# Patient Record
Sex: Male | Born: 1965 | Race: White | Hispanic: No | Marital: Married | Smoking: Former smoker
Health system: Southern US, Community
[De-identification: ages and names within clinical notes are randomized; demographics above are authoritative.]

## PROBLEM LIST (undated history)

## (undated) DIAGNOSIS — M1990 Unspecified osteoarthritis, unspecified site: Secondary | ICD-10-CM

## (undated) DIAGNOSIS — I10 Essential (primary) hypertension: Secondary | ICD-10-CM

## (undated) DIAGNOSIS — N289 Disorder of kidney and ureter, unspecified: Secondary | ICD-10-CM

## (undated) DIAGNOSIS — Z8489 Family history of other specified conditions: Secondary | ICD-10-CM

## (undated) DIAGNOSIS — T8859XA Other complications of anesthesia, initial encounter: Secondary | ICD-10-CM

## (undated) DIAGNOSIS — K219 Gastro-esophageal reflux disease without esophagitis: Secondary | ICD-10-CM

## (undated) HISTORY — PX: CHOLECYSTECTOMY: SHX55

---

## 1999-01-02 ENCOUNTER — Encounter: Payer: Self-pay | Admitting: Emergency Medicine

## 1999-01-02 ENCOUNTER — Emergency Department (HOSPITAL_COMMUNITY): Admission: EM | Admit: 1999-01-02 | Discharge: 1999-01-02 | Payer: Self-pay | Admitting: Emergency Medicine

## 2002-03-24 ENCOUNTER — Emergency Department (HOSPITAL_COMMUNITY): Admission: EM | Admit: 2002-03-24 | Discharge: 2002-03-24 | Payer: Self-pay | Admitting: *Deleted

## 2012-03-22 ENCOUNTER — Emergency Department (HOSPITAL_COMMUNITY): Payer: Worker's Compensation

## 2012-03-22 ENCOUNTER — Emergency Department (HOSPITAL_COMMUNITY)
Admission: EM | Admit: 2012-03-22 | Discharge: 2012-03-22 | Disposition: A | Discharge status: Home / Self Care | Payer: Worker's Compensation | Attending: Emergency Medicine | Admitting: Emergency Medicine

## 2012-03-22 ENCOUNTER — Encounter (HOSPITAL_COMMUNITY): Payer: Self-pay | Admitting: Cardiology

## 2012-03-22 DIAGNOSIS — S86919A Strain of unspecified muscle(s) and tendon(s) at lower leg level, unspecified leg, initial encounter: Secondary | ICD-10-CM

## 2012-03-22 DIAGNOSIS — S86819A Strain of other muscle(s) and tendon(s) at lower leg level, unspecified leg, initial encounter: Secondary | ICD-10-CM | POA: Insufficient documentation

## 2012-03-22 DIAGNOSIS — I10 Essential (primary) hypertension: Secondary | ICD-10-CM | POA: Insufficient documentation

## 2012-03-22 DIAGNOSIS — Z79899 Other long term (current) drug therapy: Secondary | ICD-10-CM | POA: Insufficient documentation

## 2012-03-22 DIAGNOSIS — X58XXXA Exposure to other specified factors, initial encounter: Secondary | ICD-10-CM | POA: Insufficient documentation

## 2012-03-22 DIAGNOSIS — E119 Type 2 diabetes mellitus without complications: Secondary | ICD-10-CM | POA: Insufficient documentation

## 2012-03-22 DIAGNOSIS — K219 Gastro-esophageal reflux disease without esophagitis: Secondary | ICD-10-CM | POA: Insufficient documentation

## 2012-03-22 DIAGNOSIS — S838X9A Sprain of other specified parts of unspecified knee, initial encounter: Secondary | ICD-10-CM | POA: Insufficient documentation

## 2012-03-22 DIAGNOSIS — Y9301 Activity, walking, marching and hiking: Secondary | ICD-10-CM | POA: Insufficient documentation

## 2012-03-22 HISTORY — DX: Gastro-esophageal reflux disease without esophagitis: K21.9

## 2012-03-22 HISTORY — DX: Essential (primary) hypertension: I10

## 2012-03-22 HISTORY — DX: Disorder of kidney and ureter, unspecified: N28.9

## 2012-03-22 MED ORDER — IBUPROFEN 800 MG PO TABS: 800.0000 mg | ORAL_TABLET | Freq: Three times a day (TID) | ORAL | Status: DC

## 2012-03-22 MED ORDER — IBUPROFEN 600 MG PO TABS: 600.0000 mg | ORAL_TABLET | Freq: Four times a day (QID) | ORAL | Status: DC | PRN

## 2012-03-22 NOTE — ED Provider Notes (Signed)
History     CSN: [id number]  Arrival date & time 03/22/12  [place]   First MD Initiated Contact with Patient 03/22/12 1903      Chief Complaint  Patient presents with  . Knee Pain     Patient is a 46 y.o. male presenting with knee pain. The history is provided by the patient.  Knee Pain This is a new problem. The current episode started less than 1 hour ago. The problem occurs constantly. The problem has not changed since onset.Pertinent negatives include no abdominal pain and no shortness of breath. The symptoms are aggravated by standing. The symptoms are relieved by rest. He has tried nothing for the symptoms.  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  Reports h/o "cleaning out cartilage" in his knees, but this was not recently    Past Medical History  Diagnosis Date  . GERD (gastroesophageal reflux disease)   . Diabetes mellitus   . Hypertension   . Renal disorder     Kidney Stones    History reviewed. No pertinent past surgical history.  History reviewed. No pertinent family history.  History  Substance Use Topics  . Smoking status: Not on file  . Smokeless tobacco: Not on file  . Alcohol Use:       Review of Systems  Respiratory: Negative for shortness of breath.   Gastrointestinal: Negative for abdominal pain.    Allergies  Penicillins and Erythromycin  Home Medications   Current Outpatient Rx  Name Route Sig Dispense Refill  . GLIPIZIDE 5 MG PO TABS Oral Take 5 mg by mouth 2 (two) times daily before a meal.    . LISINOPRIL 10 MG PO TABS Oral Take 10 mg by mouth every morning.    Marland Kitchen METFORMIN HCL 500 MG PO TABS Oral Take 1,000 mg by mouth 2 (two) times daily with a meal.    . DIABETES HEALTH FORMULA PO Oral Take 1 tablet by mouth daily.    Marland Kitchen OMEPRAZOLE 20 MG PO CPDR Oral Take 20 mg by mouth daily.       BP 150/93  Pulse 86  Resp 20  SpO2 99%  Physical Exam CONSTITUTIONAL: Well developed/well nourished HEAD AND FACE: Normocephalic/atraumatic EYES: EOMI/PERRL ENMT: Mucous membranes moist NECK: supple no meningeal signs SPINE:entire spine nontender CV: S1/S2 noted, no murmurs/rubs/gallops noted LUNGS: Lungs are clear to auscultation bilaterally, no apparent distress ABDOMEN: soft, nontender, no rebound or guarding NEURO: Pt is awake/alert, moves all extremitiesx4 EXTREMITIES: pulses normal, full ROM Tenderness to medial aspect of right knee, no bruising/edema/erythema noted.  He is able to range the right knee No other bony tenderness to the right LE/ankle/knee.  Distal pulses intact. SKIN: warm, color normal PSYCH: no abnormalities of mood noted  ED Course  Procedures    Knee imaging pending at this time   MDM  Nursing notes reviewed and considered in documentation         Joya Gaskins, MD 03/22/12 1958

## 2012-03-22 NOTE — ED Provider Notes (Signed)
Assumed pt care @ 2005.  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  Patient states he is able to ambulate, and able to flex and extend his knee but felt pain to the medial aspect of his right knee.  Pain is sharp, worsened with ambulating, and improves with rest. He denies pain to his right hip all right ankle. He denies numbness. On exam, point tenderness noted to medial aspects of right knee without overlying deformity. Negative anterior and posterior draw test. Pain with varus and valgus maneuver without laxity. Able to ambulate.   Patient will be given knee sleeves for support, rice therapy, and ibuprofen for pain.  Patient agrees to follow up with Dr. Tinnie Gens from [facility] orthopedic.    No results found for this or any previous visit. Dg Knee Complete 4 Views Right  03/22/2012  *RADIOLOGY REPORT*  Clinical Data: Pain in the medial knee from falling into a hole earlier.  No bruising or swelling.  RIGHT KNEE - COMPLETE 4+ VIEW  Comparison: None.  Findings: Degenerative changes are identified, involving the lateral patellofemoral compartments.  No evidence for acute fracture.  No definite joint effusion.  IMPRESSION: Mild degenerative changes. No evidence for acute osseous abnormality.  Original Report Authenticated By: Patterson Hammersmith, M.D.      Fayrene Helper, PA-C 03/22/12 2023

## 2012-03-22 NOTE — ED Notes (Signed)
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 Denies any other symptoms at this time.

## 2012-03-22 NOTE — Discharge Instructions (Signed)
Knee Sprain  You have a knee sprain. Sprains are painful injuries to the joints. A sprain is a partial or complete tearing of ligaments. Ligaments are tough, fibrous tissues that hold bones together at the joints. A strain (sprain) has occurred when a ligament is stretched or damaged. This injury may take several weeks to heal. This is often the same length of time as a bone fracture (break in bone) takes to heal. Even though a fracture (bone break) may not have occurred, the recovery times may be similar.  HOME CARE INSTRUCTIONS   · Rest the injured area for as long as directed by your caregiver. Then slowly start using the joint as directed by your caregiver and as the pain allows. Use crutches as directed. If the knee was splinted or casted, continue use and care as directed. If an ace bandage has been applied today, it should be removed and reapplied every 3 to 4 hours. It should not be applied tightly, but firmly enough to keep swelling down. Watch toes and feet for swelling, bluish discoloration, coldness, numbness or excessive pain. If any of these symptoms occur, remove the ace bandage and reapply more loosely.If these symptoms persist, seek medical attention.  · For the first 24 hours, lie down. Keep the injured extremity elevated on two pillows.  · Apply ice to the injured area for 15 to 20 minutes every couple hours. Repeat this 3 to 4 times per day for the first 48 hours. Put the ice in a plastic bag and place a towel between the bag of ice and your skin.  · Wear any splinting, casting, or elastic bandage applications as instructed.  · Only take over-the-counter or prescription medicines for pain, discomfort, or fever as directed by your caregiver. Do not use aspirin immediately after the injury unless instructed by your caregiver. Aspirin can cause increased bleeding and bruising of the tissues.  · If you were given crutches, continue to use them as instructed. Do not resume weight bearing on the  affected extremity until instructed.  Persistent pain and inability to use the injured area as directed for more than 2 to 3 days are warning signs. If this happens you should see a caregiver for a follow-up visit as soon as possible. Initially, a hairline fracture (this is the same as a broken bone) may not be evident on x-rays. Persistent pain and swelling indicate that further evaluation, non-weight bearing (use of crutches as instructed), and/or further x-rays are indicated. X-rays may sometimes not show a small fracture until a week or ten days later. Make a follow-up appointment with your own caregiver or one to whom we have referred you. A radiologist (specialist in reading x-rays) may re-read your X-rays. Make sure you know how you are to get your x-ray results. Do not assume everything is normal if you do not hear from us.  SEEK MEDICAL CARE IF:   · Bruising, swelling, or pain increases.  · You have cold or numb toes  · You have continuing difficulty or pain with walking.  SEEK IMMEDIATE MEDICAL CARE IF:   · Your toes are cold, numb or blue.  · The pain is not responding to medications and continues to stay the same or get worse.  MAKE SURE YOU:   · Understand these instructions.  · Will watch your condition.  · Will get help right away if you are not doing well or get worse.  Document Released: 11/24/2005 Document Revised: 11/13/2011 Document Reviewed: 11/08/2007    [employer]® Patient Information ©2012 [employer].

## 2012-03-24 NOTE — ED Provider Notes (Signed)
Medical screening examination/treatment/procedure(s) were conducted as a shared visit with non-physician practitioner(s) and myself.  I personally evaluated the patient during the encounter   Joya Gaskins, MD 03/24/12 [phone]

## 2015-02-28 ENCOUNTER — Emergency Department (HOSPITAL_COMMUNITY): Payer: 59

## 2015-02-28 ENCOUNTER — Inpatient Hospital Stay (HOSPITAL_COMMUNITY)
Admission: EM | Admit: 2015-02-28 | Discharge: 2015-03-07 | DRG: 418 | Disposition: A | Discharge status: Home Health | Payer: 59 | Attending: Internal Medicine | Admitting: Internal Medicine

## 2015-02-28 ENCOUNTER — Encounter (HOSPITAL_COMMUNITY): Payer: Self-pay

## 2015-02-28 DIAGNOSIS — Y838 Other surgical procedures as the cause of abnormal reaction of the patient, or of later complication, without mention of misadventure at the time of the procedure: Secondary | ICD-10-CM | POA: Diagnosis not present

## 2015-02-28 DIAGNOSIS — Z880 Allergy status to penicillin: Secondary | ICD-10-CM

## 2015-02-28 DIAGNOSIS — R162 Hepatomegaly with splenomegaly, not elsewhere classified: Secondary | ICD-10-CM | POA: Diagnosis present

## 2015-02-28 DIAGNOSIS — R1011 Right upper quadrant pain: Secondary | ICD-10-CM

## 2015-02-28 DIAGNOSIS — R739 Hyperglycemia, unspecified: Secondary | ICD-10-CM

## 2015-02-28 DIAGNOSIS — L7621 Postprocedural hemorrhage and hematoma of skin and subcutaneous tissue following a dermatologic procedure: Secondary | ICD-10-CM | POA: Diagnosis not present

## 2015-02-28 DIAGNOSIS — Z87442 Personal history of urinary calculi: Secondary | ICD-10-CM

## 2015-02-28 DIAGNOSIS — M25519 Pain in unspecified shoulder: Secondary | ICD-10-CM

## 2015-02-28 DIAGNOSIS — K812 Acute cholecystitis with chronic cholecystitis: Secondary | ICD-10-CM | POA: Diagnosis present

## 2015-02-28 DIAGNOSIS — K8013 Calculus of gallbladder with acute and chronic cholecystitis with obstruction: Principal | ICD-10-CM | POA: Diagnosis present

## 2015-02-28 DIAGNOSIS — Z6841 Body Mass Index (BMI) 40.0 and over, adult: Secondary | ICD-10-CM

## 2015-02-28 DIAGNOSIS — I10 Essential (primary) hypertension: Secondary | ICD-10-CM | POA: Diagnosis present

## 2015-02-28 DIAGNOSIS — K529 Noninfective gastroenteritis and colitis, unspecified: Secondary | ICD-10-CM

## 2015-02-28 DIAGNOSIS — R1110 Vomiting, unspecified: Secondary | ICD-10-CM

## 2015-02-28 DIAGNOSIS — Y92234 Operating room of hospital as the place of occurrence of the external cause: Secondary | ICD-10-CM

## 2015-02-28 DIAGNOSIS — R7989 Other specified abnormal findings of blood chemistry: Secondary | ICD-10-CM | POA: Diagnosis not present

## 2015-02-28 DIAGNOSIS — Z79899 Other long term (current) drug therapy: Secondary | ICD-10-CM

## 2015-02-28 DIAGNOSIS — Z419 Encounter for procedure for purposes other than remedying health state, unspecified: Secondary | ICD-10-CM

## 2015-02-28 DIAGNOSIS — K5790 Diverticulosis of intestine, part unspecified, without perforation or abscess without bleeding: Secondary | ICD-10-CM | POA: Diagnosis present

## 2015-02-28 DIAGNOSIS — K7581 Nonalcoholic steatohepatitis (NASH): Secondary | ICD-10-CM | POA: Diagnosis present

## 2015-02-28 DIAGNOSIS — R109 Unspecified abdominal pain: Secondary | ICD-10-CM

## 2015-02-28 DIAGNOSIS — Z87891 Personal history of nicotine dependence: Secondary | ICD-10-CM

## 2015-02-28 DIAGNOSIS — E1165 Type 2 diabetes mellitus with hyperglycemia: Secondary | ICD-10-CM | POA: Diagnosis present

## 2015-02-28 DIAGNOSIS — K219 Gastro-esophageal reflux disease without esophagitis: Secondary | ICD-10-CM | POA: Diagnosis present

## 2015-02-28 DIAGNOSIS — W1789XA Other fall from one level to another, initial encounter: Secondary | ICD-10-CM | POA: Diagnosis not present

## 2015-02-28 DIAGNOSIS — E785 Hyperlipidemia, unspecified: Secondary | ICD-10-CM | POA: Diagnosis present

## 2015-02-28 DIAGNOSIS — L0390 Cellulitis, unspecified: Secondary | ICD-10-CM | POA: Diagnosis not present

## 2015-02-28 DIAGNOSIS — K8020 Calculus of gallbladder without cholecystitis without obstruction: Secondary | ICD-10-CM | POA: Diagnosis present

## 2015-02-28 DIAGNOSIS — R1031 Right lower quadrant pain: Secondary | ICD-10-CM | POA: Diagnosis not present

## 2015-02-28 DIAGNOSIS — Z881 Allergy status to other antibiotic agents status: Secondary | ICD-10-CM

## 2015-02-28 LAB — CBC WITH DIFFERENTIAL/PLATELET
BASOS ABS: 0 10*3/uL (ref 0.0–0.1)
BASOS PCT: 0 % (ref 0–1)
EOS ABS: 0.3 10*3/uL (ref 0.0–0.7)
Eosinophils Relative: 2 % (ref 0–5)
HCT: 47.3 % (ref 39.0–52.0)
Hemoglobin: 16.1 g/dL (ref 13.0–17.0)
LYMPHS ABS: 0.7 10*3/uL (ref 0.7–4.0)
LYMPHS PCT: 5 % — AB (ref 12–46)
MCH: 27.8 pg (ref 26.0–34.0)
MCHC: 34 g/dL (ref 30.0–36.0)
MCV: 81.7 fL (ref 78.0–100.0)
Monocytes Absolute: 0.7 10*3/uL (ref 0.1–1.0)
Monocytes Relative: 5 % (ref 3–12)
NEUTROS ABS: 11.6 10*3/uL — AB (ref 1.7–7.7)
NEUTROS PCT: 88 % — AB (ref 43–77)
PLATELETS: 209 10*3/uL (ref 150–400)
RBC: 5.79 MIL/uL (ref 4.22–5.81)
RDW: 13.8 % (ref 11.5–15.5)
WBC: 13.3 10*3/uL — AB (ref 4.0–10.5)

## 2015-02-28 LAB — URINE MICROSCOPIC-ADD ON

## 2015-02-28 LAB — I-STAT TROPONIN, ED: Troponin i, poc: 0 ng/mL (ref 0.00–0.08)

## 2015-02-28 LAB — URINALYSIS, ROUTINE W REFLEX MICROSCOPIC
Bilirubin Urine: NEGATIVE
Ketones, ur: NEGATIVE mg/dL
LEUKOCYTES UA: NEGATIVE
Nitrite: NEGATIVE
Protein, ur: 100 mg/dL — AB
SPECIFIC GRAVITY, URINE: 1.027 (ref 1.005–1.030)
Urobilinogen, UA: 0.2 mg/dL (ref 0.0–1.0)
pH: 5 (ref 5.0–8.0)

## 2015-02-28 LAB — COMPREHENSIVE METABOLIC PANEL
ALBUMIN: 4.8 g/dL (ref 3.5–5.2)
ALK PHOS: 65 U/L (ref 39–117)
ALT: 47 U/L (ref 0–53)
AST: 28 U/L (ref 0–37)
Anion gap: 12 (ref 5–15)
BILIRUBIN TOTAL: 1.4 mg/dL — AB (ref 0.3–1.2)
BUN: 18 mg/dL (ref 6–23)
CHLORIDE: 103 mmol/L (ref 96–112)
CO2: 23 mmol/L (ref 19–32)
Calcium: 9.2 mg/dL (ref 8.4–10.5)
Creatinine, Ser: 1.14 mg/dL (ref 0.50–1.35)
GFR calc Af Amer: 88 mL/min — ABNORMAL LOW (ref >90)
GFR calc non Af Amer: 76 mL/min — ABNORMAL LOW (ref >90)
Glucose, Bld: 279 mg/dL — ABNORMAL HIGH (ref 70–99)
POTASSIUM: 4.1 mmol/L (ref 3.5–5.1)
Sodium: 138 mmol/L (ref 135–145)
Total Protein: 7.6 g/dL (ref 6.0–8.3)

## 2015-02-28 LAB — LIPASE, BLOOD: Lipase: 45 U/L (ref 11–59)

## 2015-02-28 MED ORDER — ONDANSETRON HCL 4 MG/2ML IJ SOLN
4.0000 mg | Freq: Once | INTRAMUSCULAR | Status: AC
  Administered 2015-02-28: 4 mg via INTRAVENOUS
  Filled 2015-02-28: qty 2

## 2015-02-28 MED ORDER — IOHEXOL 300 MG/ML  SOLN
100.0000 mL | Freq: Once | INTRAMUSCULAR | Status: AC | PRN
  Administered 2015-02-28: 100 mL via INTRAVENOUS

## 2015-02-28 MED ORDER — ONDANSETRON HCL 4 MG/2ML IJ SOLN
4.0000 mg | Freq: Once | INTRAMUSCULAR | Status: AC
  Administered 2015-02-28: 4 mg via INTRAVENOUS

## 2015-02-28 MED ORDER — FENTANYL CITRATE 0.05 MG/ML IJ SOLN
INTRAMUSCULAR | Status: AC
  Filled 2015-02-28: qty 2

## 2015-02-28 MED ORDER — SODIUM CHLORIDE 0.9 % IV SOLN
INTRAVENOUS | Status: DC
  Administered 2015-02-28: 20:00:00 via INTRAVENOUS

## 2015-02-28 MED ORDER — ONDANSETRON HCL 4 MG/2ML IJ SOLN
INTRAMUSCULAR | Status: AC
  Filled 2015-02-28: qty 2

## 2015-02-28 MED ORDER — HYDROMORPHONE HCL 1 MG/ML IJ SOLN
1.0000 mg | Freq: Once | INTRAMUSCULAR | Status: AC
  Administered 2015-03-01: 1 mg via INTRAVENOUS
  Filled 2015-02-28: qty 1

## 2015-02-28 MED ORDER — ONDANSETRON HCL 4 MG/2ML IJ SOLN
4.0000 mg | Freq: Once | INTRAMUSCULAR | Status: AC
  Administered 2015-03-01: 4 mg via INTRAVENOUS
  Filled 2015-02-28: qty 2

## 2015-02-28 MED ORDER — HYDROMORPHONE HCL 1 MG/ML IJ SOLN
1.0000 mg | Freq: Once | INTRAMUSCULAR | Status: AC
  Administered 2015-02-28: 1 mg via INTRAVENOUS
  Filled 2015-02-28: qty 1

## 2015-02-28 MED ORDER — SODIUM CHLORIDE 0.9 % IV BOLUS (SEPSIS)
1000.0000 mL | Freq: Once | INTRAVENOUS | Status: AC
  Administered 2015-02-28: 1000 mL via INTRAVENOUS

## 2015-02-28 MED ORDER — FENTANYL CITRATE 0.05 MG/ML IJ SOLN
50.0000 ug | Freq: Once | INTRAMUSCULAR | Status: AC
  Administered 2015-02-28: 50 ug via INTRAVENOUS

## 2015-02-28 MED ORDER — PANTOPRAZOLE SODIUM 40 MG IV SOLR
40.0000 mg | Freq: Once | INTRAVENOUS | Status: AC
  Administered 2015-02-28: 40 mg via INTRAVENOUS
  Filled 2015-02-28: qty 40

## 2015-02-28 NOTE — ED Notes (Signed)
Pt given urinal and made aware of need for urine specimen 

## 2015-02-28 NOTE — ED Notes (Addendum)
Pt c/o upper abdominal pain and n/v starting this afternoon.  Pain score 10/10.  Pt reports pain decreases w/ palpation.  Denies diarrhea and GU complaints.

## 2015-02-28 NOTE — ED Notes (Signed)
Patient is aware a urine sample is needed. He will inform us when he can give. Will notify nurse and edp

## 2015-02-28 NOTE — ED Notes (Signed)
Pt actively vomiting at this time, MD notified zofran 4mg  IV given per protocol.

## 2015-02-28 NOTE — ED Notes (Signed)
Bed: WA16 Expected date:  Expected time:  Means of arrival:  Comments: EMS 

## 2015-03-01 ENCOUNTER — Inpatient Hospital Stay (HOSPITAL_COMMUNITY): Payer: 59 | Admitting: Anesthesiology

## 2015-03-01 ENCOUNTER — Emergency Department (HOSPITAL_COMMUNITY): Payer: 59

## 2015-03-01 ENCOUNTER — Encounter (HOSPITAL_COMMUNITY)
Admission: EM | Disposition: A | Discharge status: Home Health | Payer: Self-pay | Source: Home / Self Care | Attending: Internal Medicine

## 2015-03-01 ENCOUNTER — Inpatient Hospital Stay (HOSPITAL_COMMUNITY): Payer: 59

## 2015-03-01 ENCOUNTER — Encounter (HOSPITAL_COMMUNITY): Payer: Self-pay | Admitting: Certified Registered"

## 2015-03-01 DIAGNOSIS — K819 Cholecystitis, unspecified: Secondary | ICD-10-CM

## 2015-03-01 DIAGNOSIS — Z79899 Other long term (current) drug therapy: Secondary | ICD-10-CM | POA: Diagnosis not present

## 2015-03-01 DIAGNOSIS — R109 Unspecified abdominal pain: Secondary | ICD-10-CM | POA: Diagnosis present

## 2015-03-01 DIAGNOSIS — Y838 Other surgical procedures as the cause of abnormal reaction of the patient, or of later complication, without mention of misadventure at the time of the procedure: Secondary | ICD-10-CM | POA: Diagnosis not present

## 2015-03-01 DIAGNOSIS — E785 Hyperlipidemia, unspecified: Secondary | ICD-10-CM | POA: Diagnosis present

## 2015-03-01 DIAGNOSIS — L7621 Postprocedural hemorrhage and hematoma of skin and subcutaneous tissue following a dermatologic procedure: Secondary | ICD-10-CM | POA: Diagnosis not present

## 2015-03-01 DIAGNOSIS — Z87891 Personal history of nicotine dependence: Secondary | ICD-10-CM | POA: Diagnosis not present

## 2015-03-01 DIAGNOSIS — K8013 Calculus of gallbladder with acute and chronic cholecystitis with obstruction: Secondary | ICD-10-CM | POA: Diagnosis present

## 2015-03-01 DIAGNOSIS — Z87442 Personal history of urinary calculi: Secondary | ICD-10-CM | POA: Diagnosis not present

## 2015-03-01 DIAGNOSIS — Z881 Allergy status to other antibiotic agents status: Secondary | ICD-10-CM | POA: Diagnosis not present

## 2015-03-01 DIAGNOSIS — R1031 Right lower quadrant pain: Secondary | ICD-10-CM | POA: Diagnosis present

## 2015-03-01 DIAGNOSIS — R7989 Other specified abnormal findings of blood chemistry: Secondary | ICD-10-CM | POA: Diagnosis not present

## 2015-03-01 DIAGNOSIS — K7581 Nonalcoholic steatohepatitis (NASH): Secondary | ICD-10-CM | POA: Diagnosis present

## 2015-03-01 DIAGNOSIS — W1789XA Other fall from one level to another, initial encounter: Secondary | ICD-10-CM | POA: Diagnosis not present

## 2015-03-01 DIAGNOSIS — K812 Acute cholecystitis with chronic cholecystitis: Secondary | ICD-10-CM | POA: Diagnosis not present

## 2015-03-01 DIAGNOSIS — Z6841 Body Mass Index (BMI) 40.0 and over, adult: Secondary | ICD-10-CM | POA: Diagnosis not present

## 2015-03-01 DIAGNOSIS — R1084 Generalized abdominal pain: Secondary | ICD-10-CM | POA: Diagnosis not present

## 2015-03-01 DIAGNOSIS — R1011 Right upper quadrant pain: Secondary | ICD-10-CM | POA: Diagnosis not present

## 2015-03-01 DIAGNOSIS — K219 Gastro-esophageal reflux disease without esophagitis: Secondary | ICD-10-CM | POA: Diagnosis present

## 2015-03-01 DIAGNOSIS — L0390 Cellulitis, unspecified: Secondary | ICD-10-CM | POA: Diagnosis not present

## 2015-03-01 DIAGNOSIS — R162 Hepatomegaly with splenomegaly, not elsewhere classified: Secondary | ICD-10-CM | POA: Diagnosis present

## 2015-03-01 DIAGNOSIS — Z880 Allergy status to penicillin: Secondary | ICD-10-CM | POA: Diagnosis not present

## 2015-03-01 DIAGNOSIS — K5790 Diverticulosis of intestine, part unspecified, without perforation or abscess without bleeding: Secondary | ICD-10-CM | POA: Diagnosis present

## 2015-03-01 DIAGNOSIS — K8020 Calculus of gallbladder without cholecystitis without obstruction: Secondary | ICD-10-CM | POA: Diagnosis present

## 2015-03-01 DIAGNOSIS — E1165 Type 2 diabetes mellitus with hyperglycemia: Secondary | ICD-10-CM

## 2015-03-01 DIAGNOSIS — I10 Essential (primary) hypertension: Secondary | ICD-10-CM | POA: Diagnosis present

## 2015-03-01 DIAGNOSIS — Y92234 Operating room of hospital as the place of occurrence of the external cause: Secondary | ICD-10-CM | POA: Diagnosis not present

## 2015-03-01 HISTORY — PX: LAPAROSCOPIC CHOLECYSTECTOMY SINGLE PORT: SHX5891

## 2015-03-01 LAB — GLUCOSE, CAPILLARY
GLUCOSE-CAPILLARY: 277 mg/dL — AB (ref 70–99)
GLUCOSE-CAPILLARY: 206 mg/dL — AB (ref 70–99)
GLUCOSE-CAPILLARY: 292 mg/dL — AB (ref 70–99)
Glucose-Capillary: 196 mg/dL — ABNORMAL HIGH (ref 70–99)

## 2015-03-01 LAB — LACTIC ACID, PLASMA
LACTIC ACID, VENOUS: 1.9 mmol/L (ref 0.5–2.0)
Lactic Acid, Venous: 2 mmol/L (ref 0.5–2.0)

## 2015-03-01 LAB — COMPREHENSIVE METABOLIC PANEL
ALK PHOS: 56 U/L (ref 39–117)
ALT: 40 U/L (ref 0–53)
ANION GAP: 11 (ref 5–15)
AST: 21 U/L (ref 0–37)
Albumin: 4.1 g/dL (ref 3.5–5.2)
BILIRUBIN TOTAL: 1.6 mg/dL — AB (ref 0.3–1.2)
BUN: 21 mg/dL (ref 6–23)
CO2: 24 mmol/L (ref 19–32)
Calcium: 8.1 mg/dL — ABNORMAL LOW (ref 8.4–10.5)
Chloride: 103 mmol/L (ref 96–112)
Creatinine, Ser: 1.15 mg/dL (ref 0.50–1.35)
GFR calc Af Amer: 87 mL/min — ABNORMAL LOW (ref >90)
GFR, EST NON AFRICAN AMERICAN: 75 mL/min — AB (ref >90)
Glucose, Bld: 303 mg/dL — ABNORMAL HIGH (ref 70–99)
Potassium: 3.8 mmol/L (ref 3.5–5.1)
Sodium: 138 mmol/L (ref 135–145)
TOTAL PROTEIN: 6.7 g/dL (ref 6.0–8.3)

## 2015-03-01 LAB — SURGICAL PCR SCREEN
MRSA, PCR: NEGATIVE
Staphylococcus aureus: POSITIVE — AB

## 2015-03-01 SURGERY — LAPAROSCOPIC CHOLECYSTECTOMY SINGLE SITE
Anesthesia: General | Site: Abdomen

## 2015-03-01 MED ORDER — METRONIDAZOLE IN NACL 5-0.79 MG/ML-% IV SOLN
500.0000 mg | Freq: Three times a day (TID) | INTRAVENOUS | Status: DC
  Administered 2015-03-01 – 2015-03-03 (×7): 500 mg via INTRAVENOUS
  Filled 2015-03-01 (×8): qty 100

## 2015-03-01 MED ORDER — OXYCODONE HCL 5 MG PO TABS
5.0000 mg | ORAL_TABLET | ORAL | Status: DC | PRN
  Administered 2015-03-02 – 2015-03-03 (×5): 10 mg via ORAL
  Filled 2015-03-01 (×5): qty 2

## 2015-03-01 MED ORDER — KETOROLAC TROMETHAMINE 30 MG/ML IJ SOLN
INTRAMUSCULAR | Status: DC | PRN
  Administered 2015-03-01: 30 mg via INTRAVENOUS

## 2015-03-01 MED ORDER — PROMETHAZINE HCL 25 MG/ML IJ SOLN
INTRAMUSCULAR | Status: AC
  Filled 2015-03-01: qty 1

## 2015-03-01 MED ORDER — HYDROMORPHONE HCL 1 MG/ML IJ SOLN
0.2500 mg | INTRAMUSCULAR | Status: DC | PRN
  Administered 2015-03-01: 0.5 mg via INTRAVENOUS
  Administered 2015-03-01: 0.25 mg via INTRAVENOUS

## 2015-03-01 MED ORDER — ROCURONIUM BROMIDE 100 MG/10ML IV SOLN
INTRAVENOUS | Status: AC
  Filled 2015-03-01: qty 1

## 2015-03-01 MED ORDER — NEOSTIGMINE METHYLSULFATE 10 MG/10ML IV SOLN
INTRAVENOUS | Status: AC
  Filled 2015-03-01: qty 1

## 2015-03-01 MED ORDER — ACETAMINOPHEN 650 MG RE SUPP: 650.0000 mg | Freq: Four times a day (QID) | RECTAL | Status: DC | PRN

## 2015-03-01 MED ORDER — BUPIVACAINE-EPINEPHRINE 0.25% -1:200000 IJ SOLN
INTRAMUSCULAR | Status: DC | PRN
  Administered 2015-03-01: 100 mL

## 2015-03-01 MED ORDER — GLYCOPYRROLATE 0.2 MG/ML IJ SOLN
INTRAMUSCULAR | Status: AC
  Filled 2015-03-01: qty 3

## 2015-03-01 MED ORDER — INSULIN ASPART 100 UNIT/ML ~~LOC~~ SOLN
SUBCUTANEOUS | Status: AC
  Administered 2015-03-01: 7 [IU] via SUBCUTANEOUS
  Filled 2015-03-01: qty 1

## 2015-03-01 MED ORDER — HYDROMORPHONE HCL 1 MG/ML IJ SOLN
0.5000 mg | INTRAMUSCULAR | Status: DC | PRN
  Administered 2015-03-01 – 2015-03-03 (×3): 1 mg via INTRAVENOUS
  Filled 2015-03-01 (×3): qty 1

## 2015-03-01 MED ORDER — 0.9 % SODIUM CHLORIDE (POUR BTL) OPTIME
TOPICAL | Status: DC | PRN
  Administered 2015-03-01: 1000 mL

## 2015-03-01 MED ORDER — PANTOPRAZOLE SODIUM 40 MG IV SOLR
40.0000 mg | Freq: Every day | INTRAVENOUS | Status: DC
  Administered 2015-03-01: 40 mg via INTRAVENOUS
  Filled 2015-03-01 (×2): qty 40

## 2015-03-01 MED ORDER — CHLORHEXIDINE GLUCONATE 4 % EX LIQD
1.0000 "application " | Freq: Once | CUTANEOUS | Status: DC
  Filled 2015-03-01: qty 15

## 2015-03-01 MED ORDER — INSULIN ASPART 100 UNIT/ML ~~LOC~~ SOLN
0.0000 [IU] | Freq: Every day | SUBCUTANEOUS | Status: DC
  Administered 2015-03-01: 3 [IU] via SUBCUTANEOUS
  Administered 2015-03-02 – 2015-03-05 (×2): 2 [IU] via SUBCUTANEOUS

## 2015-03-01 MED ORDER — SODIUM CHLORIDE 0.9 % IV SOLN
INTRAVENOUS | Status: DC
  Administered 2015-03-01: 1000 mL via INTRAVENOUS
  Administered 2015-03-04: 50 mL/h via INTRAVENOUS

## 2015-03-01 MED ORDER — AZTREONAM 2 G IJ SOLR: 2.0000 g | Freq: Three times a day (TID) | INTRAMUSCULAR | Status: DC

## 2015-03-01 MED ORDER — LACTATED RINGERS IR SOLN
Status: DC | PRN
  Administered 2015-03-01: 2000 mL

## 2015-03-01 MED ORDER — MORPHINE SULFATE 4 MG/ML IJ SOLN
6.0000 mg | Freq: Once | INTRAMUSCULAR | Status: AC
  Administered 2015-03-01: 6 mg via INTRAVENOUS

## 2015-03-01 MED ORDER — ACETAMINOPHEN 500 MG PO TABS
1000.0000 mg | ORAL_TABLET | Freq: Three times a day (TID) | ORAL | Status: DC
  Administered 2015-03-01 – 2015-03-07 (×14): 1000 mg via ORAL
  Filled 2015-03-01 (×20): qty 2

## 2015-03-01 MED ORDER — OXYCODONE HCL 5 MG PO TABS: 5.0000 mg | ORAL_TABLET | ORAL | Status: DC | PRN

## 2015-03-01 MED ORDER — GLYCOPYRROLATE 0.2 MG/ML IJ SOLN
INTRAMUSCULAR | Status: DC | PRN
  Administered 2015-03-01: 0.6 mg via INTRAVENOUS

## 2015-03-01 MED ORDER — MIDAZOLAM HCL 2 MG/2ML IJ SOLN
INTRAMUSCULAR | Status: AC
  Filled 2015-03-01: qty 2

## 2015-03-01 MED ORDER — NEOSTIGMINE METHYLSULFATE 10 MG/10ML IV SOLN
INTRAVENOUS | Status: DC | PRN
  Administered 2015-03-01: 4 mg via INTRAVENOUS

## 2015-03-01 MED ORDER — SODIUM CHLORIDE 0.9 % IJ SOLN
INTRAMUSCULAR | Status: AC
  Filled 2015-03-01: qty 10

## 2015-03-01 MED ORDER — PROMETHAZINE HCL 25 MG/ML IJ SOLN
6.2500 mg | INTRAMUSCULAR | Status: DC | PRN
  Administered 2015-03-01: 6.25 mg via INTRAVENOUS

## 2015-03-01 MED ORDER — ONDANSETRON HCL 4 MG PO TABS
4.0000 mg | ORAL_TABLET | Freq: Four times a day (QID) | ORAL | Status: DC | PRN
  Filled 2015-03-01: qty 1

## 2015-03-01 MED ORDER — INSULIN ASPART 100 UNIT/ML ~~LOC~~ SOLN
0.0000 [IU] | SUBCUTANEOUS | Status: DC
  Administered 2015-03-01: 2 [IU] via SUBCUTANEOUS
  Administered 2015-03-01: 5 [IU] via SUBCUTANEOUS
  Administered 2015-03-01: 7 [IU] via SUBCUTANEOUS
  Administered 2015-03-01: 3 [IU] via SUBCUTANEOUS

## 2015-03-01 MED ORDER — ONDANSETRON HCL 4 MG/2ML IJ SOLN
4.0000 mg | Freq: Four times a day (QID) | INTRAMUSCULAR | Status: DC | PRN
  Administered 2015-03-01 – 2015-03-03 (×3): 4 mg via INTRAVENOUS
  Filled 2015-03-01 (×3): qty 2

## 2015-03-01 MED ORDER — HYDROMORPHONE HCL 1 MG/ML IJ SOLN: 1.0000 mg | INTRAMUSCULAR | Status: DC | PRN

## 2015-03-01 MED ORDER — INSULIN ASPART 100 UNIT/ML ~~LOC~~ SOLN
0.0000 [IU] | Freq: Three times a day (TID) | SUBCUTANEOUS | Status: DC
  Administered 2015-03-02: 4 [IU] via SUBCUTANEOUS
  Administered 2015-03-02: 7 [IU] via SUBCUTANEOUS
  Administered 2015-03-02: 4 [IU] via SUBCUTANEOUS
  Administered 2015-03-03: 7 [IU] via SUBCUTANEOUS
  Administered 2015-03-03 – 2015-03-04 (×4): 4 [IU] via SUBCUTANEOUS
  Administered 2015-03-05: 7 [IU] via SUBCUTANEOUS
  Administered 2015-03-05 – 2015-03-07 (×6): 4 [IU] via SUBCUTANEOUS

## 2015-03-01 MED ORDER — TECHNETIUM TC 99M MEBROFENIN IV KIT
5.3000 | PACK | Freq: Once | INTRAVENOUS | Status: AC | PRN
  Administered 2015-03-01: 5 via INTRAVENOUS

## 2015-03-01 MED ORDER — ROCURONIUM BROMIDE 100 MG/10ML IV SOLN
INTRAVENOUS | Status: DC | PRN
  Administered 2015-03-01 (×2): 10 mg via INTRAVENOUS
  Administered 2015-03-01: 40 mg via INTRAVENOUS

## 2015-03-01 MED ORDER — DEXTROSE 5 % IV SOLN
2.0000 g | Freq: Three times a day (TID) | INTRAVENOUS | Status: DC
  Administered 2015-03-01 – 2015-03-03 (×7): 2 g via INTRAVENOUS
  Filled 2015-03-01 (×7): qty 2

## 2015-03-01 MED ORDER — PROPOFOL 10 MG/ML IV BOLUS
INTRAVENOUS | Status: AC
  Filled 2015-03-01: qty 20

## 2015-03-01 MED ORDER — ACETAMINOPHEN 325 MG PO TABS: 650.0000 mg | ORAL_TABLET | Freq: Four times a day (QID) | ORAL | Status: DC | PRN

## 2015-03-01 MED ORDER — LACTATED RINGERS IV BOLUS (SEPSIS): 1000.0000 mL | Freq: Three times a day (TID) | INTRAVENOUS | Status: AC | PRN

## 2015-03-01 MED ORDER — HYDRALAZINE HCL 20 MG/ML IJ SOLN: 5.0000 mg | Freq: Four times a day (QID) | INTRAMUSCULAR | Status: DC | PRN

## 2015-03-01 MED ORDER — SUCCINYLCHOLINE CHLORIDE 20 MG/ML IJ SOLN
INTRAMUSCULAR | Status: DC | PRN
  Administered 2015-03-01: 140 mg via INTRAVENOUS

## 2015-03-01 MED ORDER — ONDANSETRON HCL 4 MG/2ML IJ SOLN
INTRAMUSCULAR | Status: DC | PRN
  Administered 2015-03-01: 4 mg via INTRAVENOUS

## 2015-03-01 MED ORDER — MORPHINE SULFATE 4 MG/ML IJ SOLN
INTRAMUSCULAR | Status: AC
  Filled 2015-03-01: qty 2

## 2015-03-01 MED ORDER — SODIUM CHLORIDE 0.9 % IV SOLN
INTRAVENOUS | Status: DC
  Administered 2015-03-01 (×2): via INTRAVENOUS
  Filled 2015-03-01 (×17): qty 1000

## 2015-03-01 MED ORDER — ENOXAPARIN SODIUM 40 MG/0.4ML ~~LOC~~ SOLN
40.0000 mg | SUBCUTANEOUS | Status: DC
  Administered 2015-03-01 – 2015-03-02 (×2): 40 mg via SUBCUTANEOUS
  Filled 2015-03-01 (×3): qty 0.4

## 2015-03-01 MED ORDER — BUPIVACAINE-EPINEPHRINE 0.25% -1:200000 IJ SOLN
INTRAMUSCULAR | Status: AC
  Filled 2015-03-01: qty 2

## 2015-03-01 MED ORDER — FENTANYL CITRATE 0.05 MG/ML IJ SOLN
INTRAMUSCULAR | Status: DC | PRN
  Administered 2015-03-01: 100 ug via INTRAVENOUS
  Administered 2015-03-01 (×3): 50 ug via INTRAVENOUS

## 2015-03-01 MED ORDER — SODIUM CHLORIDE 0.9 % IJ SOLN
INTRAMUSCULAR | Status: AC
  Administered 2015-03-01: 10 mL
  Filled 2015-03-01: qty 10

## 2015-03-01 MED ORDER — ONDANSETRON HCL 4 MG/2ML IJ SOLN
INTRAMUSCULAR | Status: AC
  Filled 2015-03-01: qty 2

## 2015-03-01 MED ORDER — FENTANYL CITRATE 0.05 MG/ML IJ SOLN
INTRAMUSCULAR | Status: AC
  Filled 2015-03-01: qty 5

## 2015-03-01 MED ORDER — PROPOFOL 10 MG/ML IV BOLUS
INTRAVENOUS | Status: DC | PRN
  Administered 2015-03-01: 200 mg via INTRAVENOUS

## 2015-03-01 MED ORDER — MIDAZOLAM HCL 2 MG/2ML IJ SOLN
INTRAMUSCULAR | Status: DC | PRN
  Administered 2015-03-01: 2 mg via INTRAVENOUS

## 2015-03-01 MED ORDER — HYDROMORPHONE HCL 1 MG/ML IJ SOLN
INTRAMUSCULAR | Status: AC
  Filled 2015-03-01: qty 1

## 2015-03-01 MED ORDER — KETOROLAC TROMETHAMINE 30 MG/ML IJ SOLN
INTRAMUSCULAR | Status: AC
  Filled 2015-03-01: qty 1

## 2015-03-01 MED ORDER — LACTATED RINGERS IV SOLN
INTRAVENOUS | Status: AC
  Administered 2015-03-01: 17:00:00 via INTRAVENOUS
  Administered 2015-03-01: 1000 mL via INTRAVENOUS

## 2015-03-01 SURGICAL SUPPLY — 44 items
APPLIER CLIP 5 13 M/L LIGAMAX5 (MISCELLANEOUS) ×2
APR CLP MED LRG 5 ANG JAW (MISCELLANEOUS) ×1
BAG SPEC RTRVL LRG 6X4 10 (ENDOMECHANICALS) ×1
CABLE HIGH FREQUENCY MONO STRZ (ELECTRODE) ×2 IMPLANT
CHLORAPREP W/TINT 26ML (MISCELLANEOUS) ×2 IMPLANT
CLIP APPLIE 5 13 M/L LIGAMAX5 (MISCELLANEOUS) ×1 IMPLANT
COVER MAYO STAND STRL (DRAPES) ×2 IMPLANT
DECANTER SPIKE VIAL GLASS SM (MISCELLANEOUS) ×2 IMPLANT
DRAIN CHANNEL 19F RND (DRAIN) IMPLANT
DRAPE C-ARM 42X120 X-RAY (DRAPES) ×2 IMPLANT
DRAPE LAPAROSCOPIC ABDOMINAL (DRAPES) ×2 IMPLANT
DRAPE UTILITY XL STRL (DRAPES) ×2 IMPLANT
DRAPE WARM FLUID 44X44 (DRAPE) ×2 IMPLANT
DRSG TEGADERM 2-3/8X2-3/4 SM (GAUZE/BANDAGES/DRESSINGS) ×1 IMPLANT
DRSG TEGADERM 4X4.75 (GAUZE/BANDAGES/DRESSINGS) ×2 IMPLANT
ELECT REM PT RETURN 9FT ADLT (ELECTROSURGICAL) ×2
ELECTRODE REM PT RTRN 9FT ADLT (ELECTROSURGICAL) ×1 IMPLANT
ENDOLOOP SUT PDS II  0 18 (SUTURE) ×1
ENDOLOOP SUT PDS II 0 18 (SUTURE) IMPLANT
EVACUATOR SILICONE 100CC (DRAIN) IMPLANT
GAUZE SPONGE 2X2 8PLY STRL LF (GAUZE/BANDAGES/DRESSINGS) ×1 IMPLANT
GLOVE ECLIPSE 8.0 STRL XLNG CF (GLOVE) ×3 IMPLANT
GLOVE INDICATOR 8.0 STRL GRN (GLOVE) ×3 IMPLANT
GOWN STRL REUS W/TWL XL LVL3 (GOWN DISPOSABLE) ×4 IMPLANT
KIT BASIN OR (CUSTOM PROCEDURE TRAY) ×2 IMPLANT
NDL BIOPSY 14X6 SOFT TISS (NEEDLE) IMPLANT
NEEDLE BIOPSY 14X6 SOFT TISS (NEEDLE) ×2 IMPLANT
NS IRRIG 1000ML POUR BTL (IV SOLUTION) ×2 IMPLANT
PAD TELFA 2X3 NADH STRL (GAUZE/BANDAGES/DRESSINGS) ×1 IMPLANT
POUCH SPECIMEN RETRIEVAL 10MM (ENDOMECHANICALS) ×1 IMPLANT
SCISSORS LAP 5X35 DISP (ENDOMECHANICALS) ×1 IMPLANT
SET CHOLANGIOGRAPH MIX (MISCELLANEOUS) ×2 IMPLANT
SET IRRIG TUBING LAPAROSCOPIC (IRRIGATION / IRRIGATOR) ×2 IMPLANT
SHEARS HARMONIC ACE PLUS 36CM (ENDOMECHANICALS) ×2 IMPLANT
SPONGE GAUZE 2X2 STER 10/PKG (GAUZE/BANDAGES/DRESSINGS) ×1
SUT MNCRL AB 4-0 PS2 18 (SUTURE) ×3 IMPLANT
SUT PDS AB 1 CT1 27 (SUTURE) ×3 IMPLANT
SUT PDS AB 1 CTX 36 (SUTURE) ×2 IMPLANT
SYR 20CC LL (SYRINGE) ×2 IMPLANT
TOWEL OR 17X26 10 PK STRL BLUE (TOWEL DISPOSABLE) ×2 IMPLANT
TOWEL OR NON WOVEN STRL DISP B (DISPOSABLE) ×2 IMPLANT
TRAY LAPAROSCOPIC (CUSTOM PROCEDURE TRAY) ×2 IMPLANT
TROCAR BLADELESS OPT 5 100 (ENDOMECHANICALS) ×2 IMPLANT
TROCAR BLADELESS OPT 5 150 (ENDOMECHANICALS) ×2 IMPLANT

## 2015-03-01 NOTE — Anesthesia Postprocedure Evaluation (Signed)
  Anesthesia Post-op Note  Patient: Joseph McgregorStephen Venable  Procedure(s) Performed: Procedure(s): LAPAROSCOPIC CHOLECYSTECTOMY SINGLE SITE AND CORE LIVER BIOPSY  (N/A)  Patient Location: PACU  Anesthesia Type:General  Level of Consciousness: awake and alert   Airway and Oxygen Therapy: Patient Spontanous Breathing  Post-op Pain: mild  Post-op Assessment: Post-op Vital signs reviewed, Patient's Cardiovascular Status Stable and Respiratory Function Stable  Post-op Vital Signs: Reviewed  Filed Vitals:   03/01/15 1930  BP: 157/68  Pulse:   Temp: 37.1 C  Resp:     Complications: No apparent anesthesia complications

## 2015-03-01 NOTE — ED Provider Notes (Signed)
CSN: [id number]     Arrival date & time 02/28/15  1830 History   First MD Initiated Contact with Patient 02/28/15 1849     Chief Complaint  Patient presents with  . Abdominal Pain  . Emesis     (Consider location/radiation/quality/duration/timing/severity/associated sxs/prior Treatment) Patient is a 49 y.o. male presenting with abdominal pain and vomiting. The history is provided by the patient.  Abdominal Pain Associated symptoms: nausea and vomiting   Associated symptoms: no diarrhea, no dysuria and no fever   Emesis Associated symptoms: abdominal pain and myalgias   Associated symptoms: no diarrhea and no headaches    Patient with acute onset of periumbilical and right lower quadrant abdominal pain with nausea and dry heaves. No diarrhea. Symptoms started about an hour prior to arrival. 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 No diarrhea. Pain at its worst was 10 out of 10. Nonradiating. Symptoms started this afternoon but got worse right prior to admission to the emergency department.  Past Medical History  Diagnosis Date  . GERD (gastroesophageal reflux disease)   . Diabetes mellitus   . Hypertension   . Renal disorder     Kidney Stones   History reviewed. No pertinent past surgical history. History reviewed. No pertinent family history. History  Substance Use Topics  . Smoking status: Former Games developer  . Smokeless tobacco: Not on file  . Alcohol Use: Yes     Comment: occ    Review of Systems  Constitutional: Negative for fever.  HENT: Negative for congestion.   Eyes: Negative for redness.  Gastrointestinal: Positive for nausea, vomiting and abdominal pain. Negative for diarrhea.  Genitourinary: Negative for dysuria.  Musculoskeletal: Positive for myalgias.  Skin: Negative for rash.  Neurological: Negative for headaches.  Hematological: Does not bruise/bleed  easily.  Psychiatric/Behavioral: Negative for confusion.      Allergies  Penicillins; Levaquin; and Erythromycin  Home Medications   Prior to Admission medications   Medication Sig Start Date End Date Taking? Authorizing Provider  atorvastatin (LIPITOR) 40 MG tablet Take 40 mg by mouth daily.   Yes Historical Provider, MD  glipiZIDE (GLUCOTROL) 5 MG tablet Take 5 mg by mouth 2 (two) times daily before a meal.   Yes Historical Provider, MD  lisinopril (PRINIVIL,ZESTRIL) 10 MG tablet Take 10 mg by mouth every morning.   Yes Historical Provider, MD  metFORMIN (GLUCOPHAGE) 500 MG tablet Take 1,000 mg by mouth 2 (two) times daily with a meal.   Yes Historical Provider, MD  Multiple Vitamins-Minerals (DIABETES HEALTH FORMULA PO) Take 1 tablet by mouth daily.   Yes Historical Provider, MD  omeprazole (PRILOSEC) 20 MG capsule Take 20 mg by mouth daily.   Yes Historical Provider, MD  Specialty Vitamins Products (VITA-RX DIABETIC VITAMIN PO) Take 5 tablets by mouth daily.   Yes Historical Provider, MD   BP 155/89 mmHg  Pulse 101  Temp(Src) 99.8 F (37.7 C) (Oral)  Resp 120  SpO2 94% Physical Exam  Constitutional: He is oriented to person, place, and time. He appears well-developed and well-nourished. No distress.  HENT:  Head: Normocephalic and atraumatic.  Mouth/Throat: Oropharynx is clear and moist.  Eyes: Conjunctivae and EOM are normal. Pupils are equal, round, and reactive to light.  Neck: Normal range of motion. Neck supple.  Pulmonary/Chest: Effort normal and breath sounds normal. No respiratory distress.  Abdominal: Soft. There is tenderness.  Tender right  lower quadrant.  Musculoskeletal: Normal range of motion.  Neurological: He is alert and oriented to person, place, and time. No cranial nerve deficit. He exhibits normal muscle tone. Coordination normal.  Skin: Skin is warm. No rash noted.  Nursing note and vitals reviewed.   ED Course  Procedures (including critical care  time) Labs Review Labs Reviewed  CBC WITH DIFFERENTIAL/PLATELET - Abnormal; Notable for the following:    WBC 13.3 (*)    Neutrophils Relative % 88 (*)    Neutro Abs 11.6 (*)    Lymphocytes Relative 5 (*)    All other components within normal limits  COMPREHENSIVE METABOLIC PANEL - Abnormal; Notable for the following:    Glucose, Bld 279 (*)    Total Bilirubin 1.4 (*)    GFR calc non Af Amer 76 (*)    GFR calc Af Amer 88 (*)    All other components within normal limits  URINALYSIS, ROUTINE W REFLEX MICROSCOPIC - Abnormal; Notable for the following:    Glucose, UA >1000 (*)    Hgb urine dipstick TRACE (*)    Protein, ur 100 (*)    All other components within normal limits  URINE MICROSCOPIC-ADD ON - Abnormal; Notable for the following:    Casts GRANULAR CAST (*)    All other components within normal limits  LIPASE, BLOOD  I-STAT TROPOININ, ED    Imaging Review Ct Abdomen Pelvis W Contrast  02/28/2015   CLINICAL DATA:  Upper abdominal pain, nausea and vomiting since this afternoon.  EXAM: CT ABDOMEN AND PELVIS WITH CONTRAST  TECHNIQUE: Multidetector CT imaging of the abdomen and pelvis was performed using the standard protocol following bolus administration of intravenous contrast.  CONTRAST:  OMNIPAQUE IOHEXOL 300 MG/ML  SOLN  COMPARISON:  None.  FINDINGS: Minimal atelectasis in the lingula.  Lung bases are otherwise clear.  The liver is enlarged measuring 24.3 cm in craniocaudal dimension with diffusely decreased density. There is no focal hepatic lesion. Gallbladder is minimally distended, suspect small dependent gallstones. No gallbladder wall thickening. No biliary dilatation. The spleen is enlarged measuring 18.4 x 15.1 x 7.3 cm. No focal splenic lesion.  Pancreas, adrenal glands, and kidneys are normal. Symmetric renal enhancement and excretion.  Stomach is mildly distended with ingested contents. Fluid distending normal caliber small bowel loops. Minimal fluid in the cecum  and ascending colon. Small volume of stool throughout the remainder of the colon. There is minimal diverticulosis of the distal colon without diverticulitis. No bowel wall thickening or dilatation. The appendix is normal. No free air, free fluid, or intra-abdominal fluid collection.  The abdominal aorta is normal in caliber. No retroperitoneal adenopathy.  In the pelvis the urinary bladder is physiologically distended. Prostate gland is normal in size. There is fat in the left inguinal canal. No pelvic free fluid or adenopathy. There is mild degenerative change at the lumbosacral and thoracolumbar junction in the spine. No acute or suspicious osseous abnormalities.  IMPRESSION: 1. Fluid-filled normal caliber bowel loops without associated bowel inflammation, may reflect mild enteritis. 2. Mild diverticulosis of the distal colon without diverticulitis. 3. Hepatosplenomegaly and hepatic steatosis. Probable small gallstones within physiologically distended gallbladder.   Electronically Signed   By: Rubye Oaks M.D.   On: 02/28/2015 22:03     EKG Interpretation   Date/Time:  Wednesday February 28 2015 18:32:48 EDT Ventricular Rate:  85 PR Interval:  157 QRS Duration: 104 QT Interval:  375 QTC Calculation: 446 R Axis:   -40 Text Interpretation:  Sinus rhythm Left ventricular hypertrophy No  previous ECGs available Confirmed by Deriona Altemose  MD, Terald Jump [id number]) on  02/28/2015 8:35:17 PM      MDM   Final diagnoses:  Abdominal pain  Gastroenteritis  Hyperglycemia    Patient's symptoms and CT scan raise does some mild questions of a developing gastroenteritis. Patient's pain seemed to initially be more right lower quadrant. Patient pointed to the umbilicus and then out the lateral. Patient's pain on initial exam seem to be tender right lower quadrant is a mention. Now seems to be more right upper quadrant. Patient has a bump in the bilirubin has a little bit of leukocytosis. Gallbladder was distended  questionable small dependent gallstone. Could represent biliary colic. Blood sugars elevated but less than 300 patient does have diabetes.  Discussed with general surgery they recommend admission by medicine and perhaps GI medicine consult they will see the patient will also get ultrasound of the right upper quadrant to evaluate the gallbladder better. Patient's lipase is not elevated. Rest of liver function test are normal.  In summary some of the CT findings were consistent with like a gastroenteritis that would be viral in another findings are concerning perhaps for biliary colic. Perhaps even with a retained common bile duct stone.    Vanetta MuldersScott Lianny Molter, MD 03/01/15 [phone]

## 2015-03-01 NOTE — Op Note (Signed)
03/01/2015  6:21 PM  PATIENT:  Joseph [name]  49 y.o. male  Patient Care Team: Lesia HausenAnthony J Kummer, MD as PCP - General (Internal Medicine)  PRE-OPERATIVE DIAGNOSIS:    Acute cholecystitis.  Chronic cholecystitis with calculus.  Transaminitis   POST-OPERATIVE DIAGNOSIS:    Acute on chronic calculus cholecystitis.  Fatty change in the liver with probable steatohepatitis    PROCEDURE:  Procedure(s): LAPAROSCOPIC CHOLECYSTECTOMY SINGLE SITE CORE LIVER BIOPSY   SURGEON:  Surgeon(s): Karie SodaSteven Jalisha Enneking, MD  ASSISTANT: RN   ANESTHESIA:   local and general  EBL:  Total I/O In: 1160 [P.O.:60; I.V.:1100] Out: 1 [Stool:1]  Delay start of Pharmacological VTE agent (>24hrs) due to surgical blood loss or risk of bleeding:  no  DRAINS: none   SPECIMEN:  Source of Specimen:  Gallbladder   DISPOSITION OF SPECIMEN:  PATHOLOGY  COUNTS:  YES  PLAN OF CARE: Admit for overnight observation  PATIENT DISPOSITION:  PACU - hemodynamically stable.  INDICATION: Morbidly obese male with abdominal pain and nausea vomiting.  Usually postprandial.  Diabetes.  Some question of liver problems in the past.  Up somewhat equivocal but story suspicious.  Nuclear medicine study suspicious for cystic duct obstruction.  Recommended cholecystectomy.  Laparoscopic possible open approach.  The anatomy & physiology of hepatobiliary & pancreatic function was discussed.  The pathophysiology of gallbladder dysfunction was discussed.  Natural history risks without surgery was discussed.   I feel the risks of no intervention will lead to serious problems that outweigh the operative risks; therefore, I recommended cholecystectomy to remove the pathology.  I explained laparoscopic techniques with possible need for an open approach.  Probable cholangiogram to evaluate the bilary tract was explained as well.    Risks such as bleeding, infection, abscess, leak, injury to other organs, need for further treatment, heart  attack, death, and other risks were discussed.  I noted a good likelihood this will help address the problem.  Possibility that this will not correct all abdominal symptoms was explained.  Goals of post-operative recovery were discussed as well.  We will work to minimize complications.  An educational handout further explaining the pathology and treatment options was given as well.  Questions were answered.  The patient expresses understanding & wishes to proceed with surgery.   OR FINDINGS: He had a shrunken thickened gallbladder.  Very disc difficult to grasp.  Completely replaced with chalky pasty stones.  Very short & narrow cystic duct.  Not able to do cholangiogram.  Very large boggy floppy fatty liver.  DESCRIPTION:   The patient was identified & brought in the operating room. The patient was positioned supine with arms tucked.  He had 2 Velcro Beltz on his lower extremities.  One in the upper shin.  One on mid thigh.  He had his chest taped 2.  SCDs were active during the entire case. The patient underwent general anesthesia without any difficulty.  The abdomen was prepped and draped in a sterile fashion. A Surgical Timeout confirmed our plan.  I made a transverse curvilinear incision through the superior umbilical fold.  I placed a 5mm long port through the supraumbilical fascia using a modified Hassan cutdown technique. I began carbon dioxide insufflation. Camera inspection revealed no injury. There were no adhesions to the anterior abdominal wall supraumbilically.  I proceeded to continue with single site technique. I placed a #5 port in left upper aspect of the wound. I placed a 5 mm atraumatic grasper in the right inferior aspect of the  wound.  I turned attention to the right upper quadrant.  The gallbladder was rather thickened and elongated but the gallbladder fundus was elevated cephalad.  It was rather off the mesentery but still rather intrahepatic given the enlarged floppy hepatic  lobules/segments.  I freed the peritoneal coverings between the gallbladder infundibulum and the liver on the posteriolateral and anteriomedial walls. I alternated between Harmonic & blunt Maryland dissection to help get a good critical view of the cystic artery and cystic duct.  Indication difficult to expose the gallbladder.  I therefore did not dome down on the gallbladder to more fully free the gallbladder completely off the liver bed to better identify the anatomy.  I could good critical view of the infundibulum and cystic duct.  The cystic duct was only about 2 cm long and rather narrow.  I mobilized the cystic artery; and, after getting a good 360 view, ligated the cystic artery using the Harmonic ultrasonic dissection.   I did have a small stump bleeder.  I was able to elevated and clip it for good hemostasis.  I skeletonized the cystic duct.  I placed a clip on the infundibulum. I did a partial cystic duct-otomy and ensured patency. I placed a 5 [place] cholangiocatheter through a puncture site at the right subcostal ridge of the abdominal wall.  I attempted to directed it into the cystic duct.  However the cystic duct was too narrow to intubated.  He did not have lysed to try a more proximal location.    Because, he had did not have massive ductal dilatation, I aborted on cholangiography.  I removed the cholangiocatheter. I placed clips on the cystic duct x3.  I completed cystic duct transection.   I freed the gallbladder from its remaining attachments to the liver. I ensured hemostasis on the gallbladder fossa of the liver and elsewhere. I inspected the rest of the abdomen & detected no injury nor bleeding elsewhere.  At this point, the patient slid off the table.  It did been more than 15 minutes since we had last adjusted the bed.  His upper thorax began to slide off the bed.  Anesthesia noticed it immediately and helped protect him as he slowly slid down onto the floor.  We released the belts  on his thigh and legs and positioned him supine on the floor.  His left upper extremity had been well protected with gel pads.   We ensured that his airway was secured and his IV was secure.  He had a good strong femoral pulse.  We made sure we kept him warm with warm blankets.  We are able to confirm good saturations.  Good vitals signs.  Good blood pressure.    We removed the hover mat off the bed.  We placed the pink foam temper pubic like material directly on the bed.  We were able to long roll the patient on a firm backboard and place him directly on to the pink pad and OR bed.  We let his right arm out.  We tucked his left arm.  We used a sled under his left arm.  We used Velcro Beltz on his lower extremities 2.  We used tape on his lower extremities 2.  Used tape over his chest crosswise 2.  We did tape across his chest transversely 2.  We did a flight test and he remained secure in steep reverse Trendelenburg and moderate left side down.  We repositioned him flat.  We prepped and  draped his abdomen in a sterile fashion.  We used new gowns and ports.  We reestablished the capnoperitoneum.  Hemostasis was good.  I used attachment cautery on the gallbladder fossa of the liver and a few small locations.  There is no leak of bile.  Clips were intact.  Because his liver was massively floppy with fatty change at least and possible hemosiderin depostition, I did core liver biopsies with a 14-gauge Tru-cut through segment IV anterior liver 5.  We got 3 good cores from that.  I assured hemostasis.  I removed the gallbladder out the supraumbilical fascia inside an Endo Catch bag.  I had to open the fascia to 2.5 cm to get the very thickened gallbladder out.  I closed the fascia transversely using #1 PDS interrupted stitches. A closed the skin using 4-0 monocryl stitch.  Sterile dressing was applied. The patient was extubated & arrived in the PACU in stable condition..  I had discussed postoperative care  with the patient & his wife in the holding area.  Instructions are written in the chart as well. Dr. Sampson Goon anesthesia and myself went out and talked to the patient's wife.  I explained operative findings.  I noted the unfortunate incident of him sliding off the bed.  I explained the quick and efficient response of the operative team to stabilize and reposition him and complete the case.  She was appreciative of care.  Questions answered.  Again went over postoperative care in detail with expected recovery and goals.  She expressed understanding and appreciation.  Ardeth Sportsman, M.D., F.A.C.S. Gastrointestinal and Minimally Invasive Surgery [facility] [address], [address] [place], [place] [address] [phone] Main / Paging

## 2015-03-01 NOTE — ED Notes (Signed)
US at bedside

## 2015-03-01 NOTE — Consult Note (Signed)
Reason for Consult: abd pain Referring Physician: Dr Joseph [name] Joseph is an 49 y.o. [name].  HPI: Patient presents to ED with right sided sharp abdominal pain that started this afternoon after lunch.  He has never had this pain before.  Denies nausea, fevers or changes in bowel habits.    Past Medical History  Diagnosis Date  . GERD (gastroesophageal reflux disease)   . Diabetes mellitus   . Hypertension   . Renal disorder     Kidney Stones    History reviewed. No pertinent past surgical history.  History reviewed. No pertinent family history.  Social History:  reports that he has quit smoking. He does not have any smokeless tobacco history on file. He reports that he drinks alcohol. He reports that he does not use illicit drugs.  Allergies:  Allergies  Allergen Reactions  . Penicillins Anaphylaxis  . Levaquin [Levofloxacin In D5w] Other (See Comments)    Severe Tendonitis  . Erythromycin Rash    Azithromycin okay    Medications: I have reviewed the patient's current medications.  Results for orders placed or performed during the hospital encounter of 02/28/15 (from the past 48 hour(s))  CBC with Differential     Status: Abnormal   Collection Time: 02/28/15  6:46 PM  Result Value Ref Range   WBC 13.3 (H) 4.0 - 10.5 K/uL   RBC 5.79 4.22 - 5.81 MIL/uL   Hemoglobin 16.1 13.0 - 17.0 g/dL   HCT 47.3 39.0 - 52.0 %   MCV 81.7 78.0 - 100.0 fL   MCH 27.8 26.0 - 34.0 pg   MCHC 34.0 30.0 - 36.0 g/dL   RDW 13.8 11.5 - 15.5 %   Platelets 209 150 - 400 K/uL   Neutrophils Relative % 88 (H) 43 - 77 %   Neutro Abs 11.6 (H) 1.7 - 7.7 K/uL   Lymphocytes Relative 5 (L) 12 - 46 %   Lymphs Abs 0.7 0.7 - 4.0 K/uL   Monocytes Relative 5 3 - 12 %   Monocytes Absolute 0.7 0.1 - 1.0 K/uL   Eosinophils Relative 2 0 - 5 %   Eosinophils Absolute 0.3 0.0 - 0.7 K/uL   Basophils Relative 0 0 - 1 %   Basophils Absolute 0.0 0.0 - 0.1 K/uL  Comprehensive metabolic panel     Status:  Abnormal   Collection Time: 02/28/15  6:46 PM  Result Value Ref Range   Sodium 138 135 - 145 mmol/L   Potassium 4.1 3.5 - 5.1 mmol/L   Chloride 103 96 - 112 mmol/L   CO2 23 19 - 32 mmol/L   Glucose, Bld 279 (H) 70 - 99 mg/dL   BUN 18 6 - 23 mg/dL   Creatinine, Ser 1.14 0.50 - 1.35 mg/dL   Calcium 9.2 8.4 - 10.5 mg/dL   Total Protein 7.6 6.0 - 8.3 g/dL   Albumin 4.8 3.5 - 5.2 g/dL   AST 28 0 - 37 U/L   ALT 47 0 - 53 U/L   Alkaline Phosphatase 65 39 - 117 U/L   Total Bilirubin 1.4 (H) 0.3 - 1.2 mg/dL   GFR calc non Af Amer 76 (L) >90 mL/min   GFR calc Af Amer 88 (L) >90 mL/min    Comment: (NOTE) The eGFR has been calculated using the CKD EPI equation. This calculation has not been validated in all clinical situations. eGFR's persistently <90 mL/min signify possible Chronic Kidney Disease.    Anion gap 12 5 - 15  Lipase, blood     Status: None   Collection Time: 02/28/15  6:46 PM  Result Value Ref Range   Lipase 45 11 - 59 U/L  I-stat troponin, ED (only if pt is 47 y.o. or older & pain is above umbilicus) - do not order at [facility]     Status: None   Collection Time: 02/28/15  7:00 PM  Result Value Ref Range   Troponin i, poc 0.00 0.00 - 0.08 ng/mL   Comment 3            Comment: Due to the release kinetics of cTnI, a negative result within the first hours of the onset of symptoms does not rule out myocardial infarction with certainty. If myocardial infarction is still suspected, repeat the test at appropriate intervals.   Urinalysis, Routine w reflex microscopic     Status: Abnormal   Collection Time: 02/28/15  9:18 PM  Result Value Ref Range   Color, Urine YELLOW YELLOW   APPearance CLEAR CLEAR   Specific Gravity, Urine 1.027 1.005 - 1.030   pH 5.0 5.0 - 8.0   Glucose, UA >1000 (A) NEGATIVE mg/dL   Hgb urine dipstick TRACE (A) NEGATIVE   Bilirubin Urine NEGATIVE NEGATIVE   Ketones, ur NEGATIVE NEGATIVE mg/dL   Protein, ur 100 (A) NEGATIVE mg/dL   Urobilinogen, UA 0.2  0.0 - 1.0 mg/dL   Nitrite NEGATIVE NEGATIVE   Leukocytes, UA NEGATIVE NEGATIVE  Urine microscopic-add on     Status: Abnormal   Collection Time: 02/28/15  9:18 PM  Result Value Ref Range   Squamous Epithelial / LPF RARE RARE   WBC, UA 0-2 <3 WBC/hpf   RBC / HPF 0-2 <3 RBC/hpf   Bacteria, UA RARE RARE   Casts GRANULAR CAST (A) NEGATIVE    Comment: HYALINE CASTS    Ct Abdomen Pelvis W Contrast  02/28/2015   CLINICAL DATA:  Upper abdominal pain, nausea and vomiting since this afternoon.  EXAM: CT ABDOMEN AND PELVIS WITH CONTRAST  TECHNIQUE: Multidetector CT imaging of the abdomen and pelvis was performed using the standard protocol following bolus administration of intravenous contrast.  CONTRAST:  135m OMNIPAQUE IOHEXOL 300 MG/ML  SOLN  COMPARISON:  None.  FINDINGS: Minimal atelectasis in the lingula.  Lung bases are otherwise clear.  The liver is enlarged measuring 24.3 cm in craniocaudal dimension with diffusely decreased density. There is no focal hepatic lesion. Gallbladder is minimally distended, suspect small dependent gallstones. No gallbladder wall thickening. No biliary dilatation. The spleen is enlarged measuring 18.4 x 15.1 x 7.3 cm. No focal splenic lesion.  Pancreas, adrenal glands, and kidneys are normal. Symmetric renal enhancement and excretion.  Stomach is mildly distended with ingested contents. Fluid distending normal caliber small bowel loops. Minimal fluid in the cecum and ascending colon. Small volume of stool throughout the remainder of the colon. There is minimal diverticulosis of the distal colon without diverticulitis. No bowel wall thickening or dilatation. The appendix is normal. No free air, free fluid, or intra-abdominal fluid collection.  The abdominal aorta is normal in caliber. No retroperitoneal adenopathy.  In the pelvis the urinary bladder is physiologically distended. Prostate gland is normal in size. There is fat in the left inguinal canal. No pelvic free fluid or  adenopathy. There is mild degenerative change at the lumbosacral and thoracolumbar junction in the spine. No acute or suspicious osseous abnormalities.  IMPRESSION: 1. Fluid-filled normal caliber bowel loops without associated bowel inflammation, may reflect mild enteritis. 2. Mild diverticulosis  of the distal colon without diverticulitis. 3. Hepatosplenomegaly and hepatic steatosis. Probable small gallstones within physiologically distended gallbladder.   Electronically Signed   By: Jeb Levering M.D.   On: 02/28/2015 22:03    Review of Systems  Constitutional: Negative for fever and chills.  Respiratory: Negative for cough and shortness of breath.   Cardiovascular: Negative for chest pain and leg swelling.  Gastrointestinal: Positive for abdominal pain. Negative for nausea, vomiting, diarrhea and constipation.  Genitourinary: Negative for dysuria, urgency and frequency.  Skin: Negative for rash.  Neurological: Negative for headaches.   Blood pressure 155/89, pulse 101, temperature 99.8 F (37.7 C), temperature source Oral, resp. rate 120, SpO2 94 %. Physical Exam  Constitutional: He is oriented to person, place, and time. He appears well-developed and well-nourished.  HENT:  Head: Normocephalic and atraumatic.  Eyes: Conjunctivae are normal. Pupils are equal, round, and reactive to light.  Neck: Normal range of motion. Neck supple.  Cardiovascular: Normal rate and regular rhythm.   Respiratory: Effort normal and breath sounds normal. No respiratory distress.  GI: Soft. He exhibits no distension. There is tenderness.  Worse in RUQ  Musculoskeletal: Normal range of motion.  Neurological: He is alert and oriented to person, place, and time.  Skin: Skin is warm and dry.    Assessment/Plan: Patient with severe right sided abdominal pain without peritonitis.  Recommend RUQ [place] and admission.  Repeat LFTs in AM and consider GI consult if bilirubin still elevated.  Pt will may need a  cholecystectomy, if [place] indicates this is the cause of the pain.    Arjun Hard C. 09/01/4627, 63:81 AM

## 2015-03-01 NOTE — Anesthesia Preprocedure Evaluation (Addendum)
Anesthesia Evaluation  Patient identified by MRN, date of birth, ID band Patient awake    Reviewed: Allergy & Precautions, H&P , NPO status , Patient's Chart, lab work & pertinent test results  Airway Mallampati: I  TM Distance: >3 FB Neck ROM: Full    Dental no notable dental hx. (+) Teeth Intact, Dental Advisory Given   Pulmonary neg pulmonary ROS, former smoker,  breath sounds clear to auscultation  Pulmonary exam normal       Cardiovascular hypertension, Pt. on medications Rhythm:Regular Rate:Normal     Neuro/Psych negative neurological ROS  negative psych ROS   GI/Hepatic Neg liver ROS, GERD-  Medicated,  Endo/Other  diabetes, Type 2, Oral Hypoglycemic AgentsMorbid obesity  Renal/GU negative Renal ROS  negative genitourinary   Musculoskeletal   Abdominal   Peds  Hematology negative hematology ROS (+)   Anesthesia Other Findings   Reproductive/Obstetrics negative OB ROS                            Anesthesia Physical Anesthesia Plan  ASA: III  Anesthesia Plan: General   Post-op Pain Management:    Induction: Intravenous  Airway Management Planned: Oral ETT  Additional Equipment:   Intra-op Plan:   Post-operative Plan: Extubation in OR  Informed Consent: I have reviewed the patients History and Physical, chart, labs and discussed the procedure including the risks, benefits and alternatives for the proposed anesthesia with the patient or authorized representative who has indicated his/her understanding and acceptance.   Dental advisory given  Plan Discussed with: CRNA  Anesthesia Plan Comments:         Anesthesia Quick Evaluation

## 2015-03-01 NOTE — Transfer of Care (Signed)
Immediate Anesthesia Transfer of Care Note  Patient: Joseph McgregorStephen [name]  Procedure(s) Performed: Procedure(s) (LRB): LAPAROSCOPIC CHOLECYSTECTOMY SINGLE SITE AND CORE LIVER BIOPSY  (N/A)  Patient Location: PACU  Anesthesia Type: General  Level of Consciousness: sedated, patient cooperative and responds to stimulation  Airway & Oxygen Therapy: Patient Spontanous Breathing and Patient connected to face mask oxgen  Post-op Assessment: Report given to PACU RN and Post -op Vital signs reviewed and stable  Post vital signs: Reviewed and stable  Complications: No apparent anesthesia complications

## 2015-03-01 NOTE — Progress Notes (Signed)
PROGRESS NOTE  Joseph [name] VAN:[id number] DOB: Dec 10, 1965 DOA: 02/28/2015 PCP: Candise Che, MD  HPI: 49 year old male with a history hyperlipidemia, diabetes mellitus, and hypertension presents with one-day history of colicky abdominal pain. The patient works as a Audiological scientist for [employer] While at work on the day of admission, the patient developed right lower quadrant abdominal pain with associated nausea and vomiting that was acute in onset. Because the abdominal pain became more frequent and more severe, the patient came to the emergency department for further evaluation. The patient has been in his usual state of health prior to today's episode. He has not had any fevers, chills, chest pain, shortness breath, abdominal pain, diarrhea, dysuria, hematuria. The patient had a number of episodes of emesis without any blood. During his ED stay, the patient's abdominal pain became more right upper quadrant. CT of the abdomen and pelvis was obtained and showed mildly distended gallbladder with dependent gallstones. There was hepatosplenomegaly. There was fluid filled but normal caliber bowel loops without inflammation. It was diverticulosis without diverticulitis.   In the emergency department, the patient had a temperature of 99.8. He was tachycardic up to 120. Blood pressure was 144/90. Oxygen saturation 94% on room air. WBC was 13.3. AST 28, ALT 47, alk phosphatase 65, total bilirubin 1.4. Lipase was 45. Point-of-care troponin was negative. Serum creatinine 1.14. Urinalysis was negative for pyuria. EKG shows sinus rhythm with minimal J-point elevation in V1-V3  Subjective / 24 H Interval events - ongoing nausea, feels poorly overall, like "hit by a truck"  Assessment/Plan: Active Problems:   Cholecystitis   Abdominal pain   Type 2 diabetes mellitus with hyperglycemia   Essential hypertension   Hyperlipidemia   Abdominal pain associated with vomiting - Concerned about  cholecystitis, patient now febrile and not improving - General surgery following, HIDA scan ordered this morning - RUQ ultrasound with gallstones without definite evidence for obstruction or cholecystitis - CT abdomen and pelvis--mildly distended gallbladder with dependent gallstones. There was hepatosplenomegaly. There was fluid filled but normal caliber bowel loops without inflammation. It was diverticulosis without diverticulitis. - continue aztreonam and Flagyl (anaphylaxis to penicillin, intolerance to levofloxacin) - Blood cultures 2 sets, negative to date - supportive with fluids, pain medications, NPO  Diabetes mellitus type 2 - Hemoglobin A1c pending - NovoLog sliding scale  Hypertension - Hold lisinopril since pt is npo - Hydralazine IV prn SBP >180  Hyperlipidemia - Hold statin until patient is able to take po   Diet: Diet NPO time specified Except for: Ice Chips, Sips with Meds Fluids: NS 100 cc/h DVT Prophylaxis: Lovenox  Code Status: Full Code Family Communication: d/w wife bedside  Disposition Plan: inpatient  Consultants:  General surgery   Procedures:  None    Antibiotics Aztreonam 3/24 >> Flagyl 3/24 >>   Studies  Ct Abdomen Pelvis W Contrast  02/28/2015   CLINICAL DATA:  Upper abdominal pain, nausea and vomiting since this afternoon.  EXAM: CT ABDOMEN AND PELVIS WITH CONTRAST  TECHNIQUE: Multidetector CT imaging of the abdomen and pelvis was performed using the standard protocol following bolus administration of intravenous contrast.  CONTRAST:  164mL OMNIPAQUE IOHEXOL 300 MG/ML  SOLN  COMPARISON:  None.  FINDINGS: Minimal atelectasis in the lingula.  Lung bases are otherwise clear.  The liver is enlarged measuring 24.3 cm in craniocaudal dimension with diffusely decreased density. There is no focal hepatic lesion. Gallbladder is minimally distended, suspect small dependent gallstones. No gallbladder wall thickening. No biliary dilatation. The spleen  is enlarged measuring 18.4 x 15.1 x 7.3 cm. No focal splenic lesion.  Pancreas, adrenal glands, and kidneys are normal. Symmetric renal enhancement and excretion.  Stomach is mildly distended with ingested contents. Fluid distending normal caliber small bowel loops. Minimal fluid in the cecum and ascending colon. Small volume of stool throughout the remainder of the colon. There is minimal diverticulosis of the distal colon without diverticulitis. No bowel wall thickening or dilatation. The appendix is normal. No free air, free fluid, or intra-abdominal fluid collection.  The abdominal aorta is normal in caliber. No retroperitoneal adenopathy.  In the pelvis the urinary bladder is physiologically distended. Prostate gland is normal in size. There is fat in the left inguinal canal. No pelvic free fluid or adenopathy. There is mild degenerative change at the lumbosacral and thoracolumbar junction in the spine. No acute or suspicious osseous abnormalities.  IMPRESSION: 1. Fluid-filled normal caliber bowel loops without associated bowel inflammation, may reflect mild enteritis. 2. Mild diverticulosis of the distal colon without diverticulitis. 3. Hepatosplenomegaly and hepatic steatosis. Probable small gallstones within physiologically distended gallbladder.   Electronically Signed   By: Jeb Levering M.D.   On: 02/28/2015 22:03   US Abdomen Limited Ruq  03/01/2015   CLINICAL DATA:  Acute onset of right upper quadrant abdominal pain and vomiting. Initial encounter.  EXAM: US ABDOMEN LIMITED - RIGHT UPPER QUADRANT  COMPARISON:  None.  FINDINGS: Gallbladder:  A wall echo shadow sign is noted, with stones seen filling the gallbladder. No significant gallbladder wall thickening or pericholecystic fluid is seen. No ultrasonographic Murphy's sign is elicited, though this is not well assessed as the patient is on pain medication.  Common bile duct:  Diameter: 0.5 cm, within normal limits in caliber.  Liver:  No focal  lesion identified. Diffusely increased parenchymal echogenicity and coarsened echotexture, compatible with fatty infiltration.  IMPRESSION: 1. Wall echo shadow sign noted, with stones filling the gallbladder. No definite evidence for obstruction or cholecystitis, though the gallbladder is difficult to fully assess assess given stones. 2. Diffuse fatty infiltration within the liver.   Electronically Signed   By: Garald Balding M.D.   On: 03/01/2015 02:11   Objective  Filed Vitals:   02/28/15 2117 02/28/15 2359 03/01/15 0210 03/01/15 0630  BP: 147/83 155/89 132/82 107/55  Pulse: 88 101 93 96  Temp:  99.8 F (37.7 C) 100 F (37.8 C) 100.6 F (38.1 C)  TempSrc:  Oral Oral Oral  Resp: 16 120 20 20  Height:   '6\' 3"'$  (1.905 m)   Weight:   147.328 kg (324 lb 12.8 oz)   SpO2: 94% 94% 93% 95%    Intake/Output Summary (Last 24 hours) at 03/01/15 1030 Last data filed at 03/01/15 2297  Gross per 24 hour  Intake 328.33 ml  Output    650 ml  Net -321.67 ml   Filed Weights   03/01/15 0210  Weight: 147.328 kg (324 lb 12.8 oz)   Exam:  General:  No apparent distress  HEENT: no scleral icterus, PERRL  Cardiovascular: RRR without MRG, 2+ peripheral pulses, no edema  Respiratory: CTA biL, good air movement, no wheezing, no crackles, no rales  Abdomen: soft, mild tenderness RUQ and RLQ  MSK/Extremities: no clubbing/cyanosis, no joint swelling  Skin: no rashes  Neuro: non focal  Data Reviewed: Basic Metabolic Panel:  Recent Labs Lab 02/28/15 1846 03/01/15 0225  NA 138 138  K 4.1 3.8  CL 103 103  CO2 23 24  GLUCOSE 279*  303*  BUN 18 21  CREATININE 1.14 1.15  CALCIUM 9.2 8.1*   Liver Function Tests:  Recent Labs Lab 02/28/15 1846 03/01/15 0225  AST 28 21  ALT 47 40  ALKPHOS 65 56  BILITOT 1.4* 1.6*  PROT 7.6 6.7  ALBUMIN 4.8 4.1    Recent Labs Lab 02/28/15 1846  LIPASE 45   CBC:  Recent Labs Lab 02/28/15 1846  WBC 13.3*  NEUTROABS 11.6*  HGB 16.1  HCT  47.3  MCV 81.7  PLT 209   CBG:  Recent Labs Lab 03/01/15 0209 03/01/15 0745  GLUCAP 277* 196*   Scheduled Meds: . aztreonam  2 g Intravenous 3 times per day  . enoxaparin (LOVENOX) injection  40 mg Subcutaneous Q24H  . insulin aspart  0-9 Units Subcutaneous Q4H  . metronidazole  500 mg Intravenous 3 times per day  . pantoprazole (PROTONIX) IV  40 mg Intravenous QHS   Continuous Infusions: . sodium chloride Stopped (02/28/15 2302)  . sodium chloride 0.9 % 1,000 mL with potassium chloride 20 mEq infusion 100 mL/hr at 03/01/15 0243    Marzetta Board, MD [facility] Pager [phone]. If 7 PM - 7 AM, please contact night-coverage at [email or web address], password [facility] 03/01/2015, 10:30 AM  LOS: 0 days

## 2015-03-01 NOTE — Progress Notes (Signed)
Patient ID: Joseph [name], male   DOB: [birth year], 49 y.o.   MRN: [id number]     [facility]      [address] [place], [place] [address]    Phone: [phone] FAX: [phone]     Subjective: Sudden onset epigastric abdominal pain, then localized to the RUQ.  Associated with nausea, vomiting.  Denies diarrhea.  BM yesterday.  Symptoms started shortly after eating an egg sandwich.  He denies previous symptoms.   Objective:  Vital signs:  Filed Vitals:   02/28/15 2117 02/28/15 2359 03/01/15 0210 03/01/15 0630  BP: 147/83 155/89 132/82 107/55  Pulse: 88 101 93 96  Temp:  99.8 F (37.7 C) 100 F (37.8 C) 100.6 F (38.1 C)  TempSrc:  Oral Oral Oral  Resp: 16 120 20 20  Height:   '6\' 3"'$  (1.905 m)   Weight:   147.328 kg (324 lb 12.8 oz)   SpO2: 94% 94% 93% 95%    Last BM Date: 02/28/15  Intake/Output   Yesterday:  03/23 0701 - 03/24 0700 In: 328.3 [I.V.:328.3] Out: 650 [Urine:650] This shift: I/O last 3 completed shifts: In: 328.3 [I.V.:328.3] Out: 650 [Urine:650]    Physical Exam: General: Pt awake/alert/oriented x4 in no acute distress  Abdomen: Soft.  Nondistended.  TTP RUQ and RLQ.   No evidence of peritonitis.  No incarcerated hernias.   Problem List:   Active Problems:   Cholecystitis   Abdominal pain   Type 2 diabetes mellitus with hyperglycemia   Essential hypertension   Hyperlipidemia    Results:   Labs: Results for orders placed or performed during the hospital encounter of 02/28/15 (from the past 48 hour(s))  CBC with Differential     Status: Abnormal   Collection Time: 02/28/15  6:46 PM  Result Value Ref Range   WBC 13.3 (H) 4.0 - 10.5 K/uL   RBC 5.79 4.22 - 5.81 MIL/uL   Hemoglobin 16.1 13.0 - 17.0 g/dL   HCT 47.3 39.0 - 52.0 %   MCV 81.7 78.0 - 100.0 fL   MCH 27.8 26.0 - 34.0 pg   MCHC 34.0 30.0 - 36.0 g/dL   RDW 13.8 11.5 - 15.5 %   Platelets 209 150 - 400 K/uL   Neutrophils Relative  % 88 (H) 43 - 77 %   Neutro Abs 11.6 (H) 1.7 - 7.7 K/uL   Lymphocytes Relative 5 (L) 12 - 46 %   Lymphs Abs 0.7 0.7 - 4.0 K/uL   Monocytes Relative 5 3 - 12 %   Monocytes Absolute 0.7 0.1 - 1.0 K/uL   Eosinophils Relative 2 0 - 5 %   Eosinophils Absolute 0.3 0.0 - 0.7 K/uL   Basophils Relative 0 0 - 1 %   Basophils Absolute 0.0 0.0 - 0.1 K/uL  Comprehensive metabolic panel     Status: Abnormal   Collection Time: 02/28/15  6:46 PM  Result Value Ref Range   Sodium 138 135 - 145 mmol/L   Potassium 4.1 3.5 - 5.1 mmol/L   Chloride 103 96 - 112 mmol/L   CO2 23 19 - 32 mmol/L   Glucose, Bld 279 (H) 70 - 99 mg/dL   BUN 18 6 - 23 mg/dL   Creatinine, Ser 1.14 0.50 - 1.35 mg/dL   Calcium 9.2 8.4 - 10.5 mg/dL   Total Protein 7.6 6.0 - 8.3 g/dL   Albumin 4.8 3.5 - 5.2 g/dL   AST 28 0 - 37 U/L  ALT 47 0 - 53 U/L   Alkaline Phosphatase 65 39 - 117 U/L   Total Bilirubin 1.4 (H) 0.3 - 1.2 mg/dL   GFR calc non Af Amer 76 (L) >90 mL/min   GFR calc Af Amer 88 (L) >90 mL/min    Comment: (NOTE) The eGFR has been calculated using the CKD EPI equation. This calculation has not been validated in all clinical situations. eGFR's persistently <90 mL/min signify possible Chronic Kidney Disease.    Anion gap 12 5 - 15  Lipase, blood     Status: None   Collection Time: 02/28/15  6:46 PM  Result Value Ref Range   Lipase 45 11 - 59 U/L  I-stat troponin, ED (only if pt is 47 y.o. or older & pain is above umbilicus) - do not order at [facility]     Status: None   Collection Time: 02/28/15  7:00 PM  Result Value Ref Range   Troponin i, poc 0.00 0.00 - 0.08 ng/mL   Comment 3            Comment: Due to the release kinetics of cTnI, a negative result within the first hours of the onset of symptoms does not rule out myocardial infarction with certainty. If myocardial infarction is still suspected, repeat the test at appropriate intervals.   Urinalysis, Routine w reflex microscopic     Status: Abnormal    Collection Time: 02/28/15  9:18 PM  Result Value Ref Range   Color, Urine YELLOW YELLOW   APPearance CLEAR CLEAR   Specific Gravity, Urine 1.027 1.005 - 1.030   pH 5.0 5.0 - 8.0   Glucose, UA >1000 (A) NEGATIVE mg/dL   Hgb urine dipstick TRACE (A) NEGATIVE   Bilirubin Urine NEGATIVE NEGATIVE   Ketones, ur NEGATIVE NEGATIVE mg/dL   Protein, ur 100 (A) NEGATIVE mg/dL   Urobilinogen, UA 0.2 0.0 - 1.0 mg/dL   Nitrite NEGATIVE NEGATIVE   Leukocytes, UA NEGATIVE NEGATIVE  Urine microscopic-add on     Status: Abnormal   Collection Time: 02/28/15  9:18 PM  Result Value Ref Range   Squamous Epithelial / LPF RARE RARE   WBC, UA 0-2 <3 WBC/hpf   RBC / HPF 0-2 <3 RBC/hpf   Bacteria, UA RARE RARE   Casts GRANULAR CAST (A) NEGATIVE    Comment: HYALINE CASTS  Glucose, capillary     Status: Abnormal   Collection Time: 03/01/15  2:09 AM  Result Value Ref Range   Glucose-Capillary 277 (H) 70 - 99 mg/dL  Comprehensive metabolic panel     Status: Abnormal   Collection Time: 03/01/15  2:25 AM  Result Value Ref Range   Sodium 138 135 - 145 mmol/L   Potassium 3.8 3.5 - 5.1 mmol/L   Chloride 103 96 - 112 mmol/L   CO2 24 19 - 32 mmol/L   Glucose, Bld 303 (H) 70 - 99 mg/dL   BUN 21 6 - 23 mg/dL   Creatinine, Ser 1.15 0.50 - 1.35 mg/dL   Calcium 8.1 (L) 8.4 - 10.5 mg/dL   Total Protein 6.7 6.0 - 8.3 g/dL   Albumin 4.1 3.5 - 5.2 g/dL   AST 21 0 - 37 U/L   ALT 40 0 - 53 U/L   Alkaline Phosphatase 56 39 - 117 U/L   Total Bilirubin 1.6 (H) 0.3 - 1.2 mg/dL   GFR calc non Af Amer 75 (L) >90 mL/min   GFR calc Af Amer 87 (L) >90 mL/min  Comment: (NOTE) The eGFR has been calculated using the CKD EPI equation. This calculation has not been validated in all clinical situations. eGFR's persistently <90 mL/min signify possible Chronic Kidney Disease.    Anion gap 11 5 - 15  Lactic acid, plasma     Status: None   Collection Time: 03/01/15  2:25 AM  Result Value Ref Range   Lactic Acid, Venous 1.9  0.5 - 2.0 mmol/L  Lactic acid, plasma     Status: None   Collection Time: 03/01/15  4:45 AM  Result Value Ref Range   Lactic Acid, Venous 2.0 0.5 - 2.0 mmol/L  Glucose, capillary     Status: Abnormal   Collection Time: 03/01/15  7:45 AM  Result Value Ref Range   Glucose-Capillary 196 (H) 70 - 99 mg/dL    Imaging / Studies: Ct Abdomen Pelvis W Contrast  02/28/2015   CLINICAL DATA:  Upper abdominal pain, nausea and vomiting since this afternoon.  EXAM: CT ABDOMEN AND PELVIS WITH CONTRAST  TECHNIQUE: Multidetector CT imaging of the abdomen and pelvis was performed using the standard protocol following bolus administration of intravenous contrast.  CONTRAST:  12mL OMNIPAQUE IOHEXOL 300 MG/ML  SOLN  COMPARISON:  None.  FINDINGS: Minimal atelectasis in the lingula.  Lung bases are otherwise clear.  The liver is enlarged measuring 24.3 cm in craniocaudal dimension with diffusely decreased density. There is no focal hepatic lesion. Gallbladder is minimally distended, suspect small dependent gallstones. No gallbladder wall thickening. No biliary dilatation. The spleen is enlarged measuring 18.4 x 15.1 x 7.3 cm. No focal splenic lesion.  Pancreas, adrenal glands, and kidneys are normal. Symmetric renal enhancement and excretion.  Stomach is mildly distended with ingested contents. Fluid distending normal caliber small bowel loops. Minimal fluid in the cecum and ascending colon. Small volume of stool throughout the remainder of the colon. There is minimal diverticulosis of the distal colon without diverticulitis. No bowel wall thickening or dilatation. The appendix is normal. No free air, free fluid, or intra-abdominal fluid collection.  The abdominal aorta is normal in caliber. No retroperitoneal adenopathy.  In the pelvis the urinary bladder is physiologically distended. Prostate gland is normal in size. There is fat in the left inguinal canal. No pelvic free fluid or adenopathy. There is mild degenerative  change at the lumbosacral and thoracolumbar junction in the spine. No acute or suspicious osseous abnormalities.  IMPRESSION: 1. Fluid-filled normal caliber bowel loops without associated bowel inflammation, may reflect mild enteritis. 2. Mild diverticulosis of the distal colon without diverticulitis. 3. Hepatosplenomegaly and hepatic steatosis. Probable small gallstones within physiologically distended gallbladder.   Electronically Signed   By: Jeb Levering M.D.   On: 02/28/2015 22:03   US Abdomen Limited Ruq  03/01/2015   CLINICAL DATA:  Acute onset of right upper quadrant abdominal pain and vomiting. Initial encounter.  EXAM: US ABDOMEN LIMITED - RIGHT UPPER QUADRANT  COMPARISON:  None.  FINDINGS: Gallbladder:  A wall echo shadow sign is noted, with stones seen filling the gallbladder. No significant gallbladder wall thickening or pericholecystic fluid is seen. No ultrasonographic Murphy's sign is elicited, though this is not well assessed as the patient is on pain medication.  Common bile duct:  Diameter: 0.5 cm, within normal limits in caliber.  Liver:  No focal lesion identified. Diffusely increased parenchymal echogenicity and coarsened echotexture, compatible with fatty infiltration.  IMPRESSION: 1. Wall echo shadow sign noted, with stones filling the gallbladder. No definite evidence for obstruction or cholecystitis, though the  gallbladder is difficult to fully assess assess given stones. 2. Diffuse fatty infiltration within the liver.   Electronically Signed   By: Garald Balding M.D.   On: 03/01/2015 02:11    Medications / Allergies:  Scheduled Meds: . aztreonam  2 g Intravenous 3 times per day  . enoxaparin (LOVENOX) injection  40 mg Subcutaneous Q24H  . insulin aspart  0-9 Units Subcutaneous Q4H  . metronidazole  500 mg Intravenous 3 times per day  . pantoprazole (PROTONIX) IV  40 mg Intravenous QHS   Continuous Infusions: . sodium chloride Stopped (02/28/15 2302)  . sodium chloride  0.9 % 1,000 mL with potassium chloride 20 mEq infusion 100 mL/hr at 03/01/15 0243   PRN Meds:.acetaminophen **OR** acetaminophen, hydrALAZINE, HYDROmorphone (DILAUDID) injection, lactated ringers, ondansetron **OR** ondansetron (ZOFRAN) IV  Antibiotics: Anti-infectives    Start     Dose/Rate Route Frequency Ordered Stop   03/01/15 0600  aztreonam (AZACTAM) injection 2 g  Status:  Discontinued     2 g Intramuscular 3 times per day 03/01/15 0203 03/01/15 0207   03/01/15 0500  aztreonam (AZACTAM) 2 g in dextrose 5 % 50 mL IVPB     2 g 100 mL/hr over 30 Minutes Intravenous 3 times per day 03/01/15 0208     03/01/15 0300  metroNIDAZOLE (FLAGYL) IVPB 500 mg     500 mg 100 mL/hr over 60 Minutes Intravenous 3 times per day 03/01/15 0203          Assessment/Plan RUQ abdominal pain Cholelithiasis Abnormal LFTs Hepatosplenomegaly and hepatic steatosis  Await HIDA scan.  If positive, will proceed with cholecystectomy with IOC.  If negative, he will need a GI work up.  Will follow along.   Erby Pian, [facility] Pager [phone])   03/01/2015 9:44 AM

## 2015-03-01 NOTE — Discharge Instructions (Signed)
LAPAROSCOPIC SURGERY: POST OP INSTRUCTIONS ° °1. DIET: Follow a light bland diet the first 24 hours after arrival home, such as soup, liquids, crackers, etc.  Be sure to include lots of fluids daily.  Avoid fast food or heavy meals as your are more likely to get nauseated.  Eat a low fat the next few days after surgery.   °2. Take your usually prescribed home medications unless otherwise directed. °3. PAIN CONTROL: °a. Pain is best controlled by a usual combination of three different methods TOGETHER: °i. Ice/Heat °ii. Over the counter pain medication °iii. Prescription pain medication °b. Most patients will experience some swelling and bruising around the incisions.  Ice packs or heating pads (30-60 minutes up to 6 times a day) will help. Use ice for the first few days to help decrease swelling and bruising, then switch to heat to help relax tight/sore spots and speed recovery.  Some people prefer to use ice alone, heat alone, alternating between ice & heat.  Experiment to what works for you.  Swelling and bruising can take several weeks to resolve.   °c. It is helpful to take an over-the-counter pain medication regularly for the first few weeks.  Choose one of the following that works best for you: °i. Naproxen (Aleve, etc)  Two 220mg tabs twice a day °ii. Ibuprofen (Advil, etc) Three 200mg tabs four times a day (every meal & bedtime) °iii. Acetaminophen (Tylenol, etc) 500-650mg four times a day (every meal & bedtime) °d. A  prescription for pain medication (such as oxycodone, hydrocodone, etc) should be given to you upon discharge.  Take your pain medication as prescribed.  °i. If you are having problems/concerns with the prescription medicine (does not control pain, nausea, vomiting, rash, itching, etc), please call us [phone] to see if we need to switch you to a different pain medicine that will work better for you and/or control your side effect better. °ii. If you need a refill on your pain medication,  please contact your pharmacy.  They will contact our office to request authorization. Prescriptions will not be filled after 5 pm or on week-ends. °4. Avoid getting constipated.  Between the surgery and the pain medications, it is common to experience some constipation.  Increasing fluid intake and taking a fiber supplement (such as Metamucil, Citrucel, FiberCon, MiraLax, etc) 1-2 times a day regularly will usually help prevent this problem from occurring.  A mild laxative (prune juice, Milk of Magnesia, MiraLax, etc) should be taken according to package directions if there are no bowel movements after 48 hours.   °5. Watch out for diarrhea.  If you have many loose bowel movements, simplify your diet to bland foods & liquids for a few days.  Stop any stool softeners and decrease your fiber supplement.  Switching to mild anti-diarrheal medications (Kayopectate, Pepto Bismol) can help.  If this worsens or does not improve, please call us. °6. Wash / shower every day.  You may shower over the dressings as they are waterproof.  Continue to shower over incision(s) after the dressing is off. °7. Remove your waterproof bandages 5 days after surgery.  You may leave the incision open to air.  You may replace a dressing/Band-Aid to cover the incision for comfort if you wish.  °8. ACTIVITIES as tolerated:   °a. You may resume regular (light) daily activities beginning the next day--such as daily self-care, walking, climbing stairs--gradually increasing activities as tolerated.  If you can walk 30 minutes without difficulty, it   is safe to try more intense activity such as jogging, treadmill, bicycling, low-impact aerobics, swimming, etc. °b. Save the most intensive and strenuous activity for last such as sit-ups, heavy lifting, contact sports, etc  Refrain from any heavy lifting or straining until you are off narcotics for pain control.   °c. DO NOT PUSH THROUGH PAIN.  Let pain be your guide: If it hurts to do something, don't  do it.  Pain is your body warning you to avoid that activity for another week until the pain goes down. °d. You may drive when you are no longer taking prescription pain medication, you can comfortably wear a seatbelt, and you can safely maneuver your car and apply brakes. °e. You may have sexual intercourse when it is comfortable.  °9. FOLLOW UP in our office °a. Please call [facility] at [phone] to set up an appointment to see your surgeon in the office for a follow-up appointment approximately 2-3 weeks after your surgery. °b. Make sure that you call for this appointment the day you arrive home to insure a convenient appointment time. °10. IF YOU HAVE DISABILITY OR FAMILY LEAVE FORMS, BRING THEM TO THE OFFICE FOR PROCESSING.  DO NOT GIVE THEM TO YOUR DOCTOR. ° ° °WHEN TO CALL US [phone]: °1. Poor pain control °2. Reactions / problems with new medications (rash/itching, nausea, etc)  °3. Fever over 101.5 F (38.5 C) °4. Inability to urinate °5. Nausea and/or vomiting °6. Worsening swelling or bruising °7. Continued bleeding from incision. °8. Increased pain, redness, or drainage from the incision ° ° The clinic staff is available to answer your questions during regular business hours (8:30am-5pm).  Please don’t hesitate to call and ask to speak to one of our nurses for clinical concerns.  ° If you have a medical emergency, go to the nearest emergency room or call 911. ° A surgeon from [facility] is always on call at the hospitals ° ° °[facility] °[address], [address], [place], [place]  [address] ? °MAIN: [phone] ? TOLL FREE: [phone] ?  °FAX [phone] °[email or web address] ° °Managing Pain ° °Pain after surgery or related to activity is often due to strain/injury to muscle, tendon, nerves and/or incisions.  This pain is usually short-term and will improve in a few months.  ° °Many people find it helpful to do the following things TOGETHER  to help speed the process of healing and to get back to regular activity more quickly: ° °1. Avoid heavy physical activity °a.  no lifting greater than 20 pounds °b. Do not “push through” the pain.  Listen to your body and avoid positions and maneuvers than reproduce the pain °c. Walking is okay as tolerated, but go slowly and stop when getting sore.  °d. Remember: If it hurts to do it, then don’t do it! °2. Take Anti-inflammatory medication  °a. Take with food/snack around the clock for 1-2 weeks °i. This helps the muscle and nerve tissues become less irritable and calm down faster °b. Choose ONE of the following over-the-counter medications: °i. Naproxen 220mg tabs (ex. Aleve) 1-2 pills twice a day  °ii. Ibuprofen 200mg tabs (ex. Advil, Motrin) 3-4 pills with every meal and just before bedtime °iii. Acetaminophen 500mg tabs (Tylenol) 1-2 pills with every meal and just before bedtime °3. Use a Heating pad or Ice/Cold Pack °a. 4-6 times a day °b. May use warm bath/hottub  or showers °4. Try Gentle Massage and/or Stretching  °a.   at the area of pain many times a day b. stop if you feel pain - do not overdo it  Try these steps together to help you body heal faster and avoid making things get worse.  Doing just one of these things may not be enough.    If you are not getting better after two weeks or are noticing you are getting worse, contact our office for further advice; we may need to re-evaluate you & see what other things we can do to help.  GETTING TO GOOD BOWEL HEALTH. Irregular bowel habits such as constipation and diarrhea can lead to many problems over time.  Having one soft bowel movement a day is the most important way to prevent further problems.  The anorectal canal is designed to handle stretching and feces to safely manage our ability to get rid of solid waste (feces, poop, stool) out of our body.  BUT, hard constipated stools can act like ripping concrete bricks and diarrhea can be a burning  fire to this very sensitive area of our body, causing inflamed hemorrhoids, anal fissures, increasing risk is perirectal abscesses, abdominal pain/bloating, an making irritable bowel worse.     The goal: ONE SOFT BOWEL MOVEMENT A DAY!  To have soft, regular bowel movements:   Drink at least 8 tall glasses of water a day.    Take plenty of fiber.  Fiber is the undigested part of plant food that passes into the colon, acting s natures broom to encourage bowel motility and movement.  Fiber can absorb and hold large amounts of water. This results in a larger, bulkier stool, which is soft and easier to pass. Work gradually over several weeks up to 6 servings a day of fiber (25g a day even more if needed) in the form of: o Vegetables -- Root (potatoes, carrots, turnips), leafy green (lettuce, salad greens, celery, spinach), or cooked high residue (cabbage, broccoli, etc) o Fruit -- Fresh (unpeeled skin & pulp), Dried (prunes, apricots, cherries, etc ),  or stewed ( applesauce)  o Whole grain breads, pasta, etc (whole wheat)  o Bran cereals   Bulking Agents -- This type of water-retaining fiber generally is easily obtained each day by one of the following:  o Psyllium bran -- The psyllium plant is remarkable because its ground seeds can retain so much water. This product is available as Metamucil, Konsyl, Effersyllium, Per Diem Fiber, or the less expensive generic preparation in drug and health food stores. Although labeled a laxative, it really is not a laxative.  o Methylcellulose -- This is another fiber derived from wood which also retains water. It is available as Citrucel. o Polyethylene Glycol - and artificial fiber commonly called Miralax or Glycolax.  It is helpful for people with gassy or bloated feelings with regular fiber o Flax Seed - a less gassy fiber than psyllium  No reading or other relaxing activity while on the toilet. If bowel movements take longer than 5 minutes, you are too  constipated  AVOID CONSTIPATION.  High fiber and water intake usually takes care of this.  Sometimes a laxative is needed to stimulate more frequent bowel movements, but   Laxatives are not a good long-term solution as it can wear the colon out. o Osmotics (Milk of Magnesia, Fleets phosphosoda, Magnesium citrate, MiraLax, GoLytely) are safer than  o Stimulants (Senokot, Castor Oil, Dulcolax, Ex Lax)    o Do not take laxatives for more than 7days in a row.   IF  SEVERELY CONSTIPATED, try a Bowel Retraining Program: °o Do not use laxatives.  °o Eat a diet high in roughage, such as bran cereals and leafy vegetables.  °o Drink six (6) ounces of prune or apricot juice each morning.  °o Eat two (2) large servings of stewed fruit each day.  °o Take one (1) heaping tablespoon of a psyllium-based bulking agent twice a day. Use sugar-free sweetener when possible to avoid excessive calories.  °o Eat a normal breakfast.  °o Set aside 15 minutes after breakfast to sit on the toilet, but do not strain to have a bowel movement.  °o If you do not have a bowel movement by the third day, use an enema and repeat the above steps.  °• Controlling diarrhea °o Switch to liquids and simpler foods for a few days to avoid stressing your intestines further. °o Avoid dairy products (especially milk & ice cream) for a short time.  The intestines often can lose the ability to digest lactose when stressed. °o Avoid foods that cause gassiness or bloating.  Typical foods include beans and other legumes, cabbage, broccoli, and dairy foods.  Every person has some sensitivity to other foods, so listen to our body and avoid those foods that trigger problems for you. °o Adding fiber (Citrucel, Metamucil, psyllium, Miralax) gradually can help thicken stools by absorbing excess fluid and retrain the intestines to act more normally.  Slowly increase the dose over a few weeks.  Too much fiber too soon can backfire and cause cramping &  bloating. °o Probiotics (such as active yogurt, Align, etc) may help repopulate the intestines and colon with normal bacteria and calm down a sensitive digestive tract.  Most studies show it to be of mild help, though, and such products can be costly. °o Medicines: °- Bismuth subsalicylate (ex. Kayopectate, Pepto Bismol) every 30 minutes for up to 6 doses can help control diarrhea.  Avoid if pregnant. °- Loperamide (Immodium) can slow down diarrhea.  Start with two tablets (4mg total) first and then try one tablet every 6 hours.  Avoid if you are having fevers or severe pain.  If you are not better or start feeling worse, stop all medicines and call your doctor for advice °o Call your doctor if you are getting worse or not better.  Sometimes further testing (cultures, endoscopy, X-ray studies, bloodwork, etc) may be needed to help diagnose and treat the cause of the diarrhea. ° °Cholecystitis °Cholecystitis is an inflammation of your gallbladder. It is usually caused by a buildup of gallstones or sludge (cholelithiasis) in your gallbladder. The gallbladder stores a fluid that helps digest fats (bile). Cholecystitis is serious and needs treatment right away.  °CAUSES  °· Gallstones. Gallstones can block the tube that leads to your gallbladder, causing bile to build up. As bile builds up, the gallbladder becomes inflamed. °· Bile duct problems, such as blockage from scarring or kinking. °· Tumors. Tumors can stop bile from leaving your gallbladder correctly, causing bile to build up. As bile builds up, the gallbladder becomes inflamed. °SYMPTOMS  °· Nausea. °· Vomiting. °· Abdominal pain, especially in the upper right area of your abdomen. °· Abdominal tenderness or bloating. °· Sweating. °· Chills. °· Fever. °· Yellowing of the skin and the whites of the eyes (jaundice). °DIAGNOSIS  °Your caregiver may order blood tests to look for infection or gallbladder problems. Your caregiver may also order imaging tests, such  as an ultrasound or computed tomography (CT) scan. Further tests may include   a hepatobiliary iminodiacetic acid (HIDA) scan. This scan allows your caregiver to see your bile move from the liver to the gallbladder and to the small intestine. TREATMENT  A hospital stay is usually necessary to lessen the inflammation of your gallbladder. You may be required to not eat or drink (fast) for a certain amount of time. You may be given medicine to treat pain or an antibiotic medicine to treat an infection. Surgery may be needed to remove your gallbladder (cholecystectomy) once the inflammation has gone down. Surgery may be needed right away if you develop complications such as death of gallbladder tissue (gangrene) or a tear (perforation) of the gallbladder.  HOME CARE INSTRUCTIONS  Home care will depend on your treatment. In general:  If you were given antibiotics, take them as directed. Finish them even if you start to feel better.  Only take over-the-counter or prescription medicines for pain, discomfort, or fever as directed by your caregiver.  Follow a low-fat diet until you see your caregiver again.  Keep all follow-up visits as directed by your caregiver. SEEK IMMEDIATE MEDICAL CARE IF:   Your pain is increasing and not controlled by medicines.  Your pain moves to another part of your abdomen or to your back.  You have a fever.  You have nausea and vomiting. MAKE SURE YOU:  Understand these instructions.  Will watch your condition.  Will get help right away if you are not doing well or get worse. Document Released: 11/24/2005 Document Revised: 02/16/2012 Document Reviewed: 10/10/2011 [facility] Patient Information 2015 [place], [place]. This information is not intended to replace advice given to you by your health care provider. Make sure you discuss any questions you have with your health care provider.  Fatty Liver Fatty liver is the accumulation of fat in liver cells. It is also called  hepatosteatosis or steatohepatitis. It is normal for your liver to contain some fat. If fat is more than 5 to 10% of your liver's weight, you have fatty liver.  There are often no symptoms (problems) for years while damage is still occurring. People often learn about their fatty liver when they have medical tests for other reasons. Fat can damage your liver for years or even decades without causing problems. When it becomes severe, it can cause fatigue, weight loss, weakness, and confusion. This makes you more likely to develop more serious liver problems. The liver is the largest organ in the body. It does a lot of work and often gives no warning signs when it is sick until late in a disease. The liver has many important jobs including:  Breaking down foods.  Storing vitamins, iron, and other minerals.  Making proteins.  Making bile for food digestion.  Breaking down many products including medications, alcohol and some poisons. CAUSES  There are a number of different conditions, medications, and poisons that can cause a fatty liver. Eating too many calories causes fat to build up in the liver. Not processing and breaking fats down normally may also cause this. Certain conditions, such as obesity, diabetes, and high triglycerides also cause this. Most fatty liver patients tend to be middle-aged and over weight.  Some causes of fatty liver are:  Alcohol over consumption.  Malnutrition.  Steroid use.  Valproic acid toxicity.  Obesity.  Cushing's syndrome.  Poisons.  Tetracycline in high dosages.  Pregnancy.  Diabetes.  Hyperlipidemia.  Rapid weight loss. Some people develop fatty liver even having none of these conditions. SYMPTOMS  Fatty liver most often  causes no problems. This is called asymptomatic.  It can be diagnosed with blood tests and also by a liver biopsy.  It is one of the most common causes of minor elevations of liver enzymes on routine blood  tests.  Specialized Imaging of the liver using ultrasound, CT (computed tomography) scan, or MRI (magnetic resonance imaging) can suggest a fatty liver but a biopsy is needed to confirm it.  A biopsy involves taking a small sample of liver tissue. This is done by using a needle. It is then looked at under a microscope by a specialist. TREATMENT  It is important to treat the cause. Simple fatty liver without a medical reason may not need treatment.  Weight loss, fat restriction, and exercise in overweight patients produces inconsistent results but is worth trying.  Fatty liver due to alcohol toxicity may not improve even with stopping drinking.  Good control of diabetes may reduce fatty liver.  Lower your triglycerides through diet, medication or both.  Eat a balanced, healthy diet.  Increase your physical activity.  Get regular checkups from a liver specialist.  There are no medical or surgical treatments for a fatty liver or NASH, but improving your diet and increasing your exercise may help prevent or reverse some of the damage. PROGNOSIS  Fatty liver may cause no damage or it can lead to an inflammation of the liver. This is, called steatohepatitis. When it is linked to alcohol abuse, it is called alcoholic steatohepatitis. It often is not linked to alcohol. It is then called nonalcoholic steatohepatitis, or NASH. Over time the liver may become scarred and hardened. This condition is called cirrhosis. Cirrhosis is serious and may lead to liver failure or cancer. NASH is one of the leading causes of cirrhosis. About 10-20% of Americans have fatty liver and a smaller 2-5% has NASH. Document Released: 01/09/2006 Document Revised: 02/16/2012 Document Reviewed: 04/05/2014 [facility] Patient Information 2015 [place], [place]. This information is not intended to replace advice given to you by your health care provider. Make sure you discuss any questions you have with your health care  provider.

## 2015-03-01 NOTE — H&P (Signed)
History and Physical  Joseph [name] ZJF:[id number] DOB: [birth year] DOA: 02/28/2015   PCP: Lesia Hausen, MD   Chief Complaint: Abdominal pain and vomiting  HPI:  49 year old male with a history hyperlipidemia, diabetes mellitus, and hypertension presents with one-day history of colicky abdominal pain. The patient works as a Radiation protection practitioner for [employer]. While at work on the day of admission, the patient developed right lower quadrant abdominal pain with associated nausea and vomiting that was acute in onset. Because the abdominal pain became more frequent and more severe, the patient came to the emergency department for further evaluation. The patient has been in his usual state of health prior to today's episode. He has not had any fevers, chills, chest pain, shortness breath, abdominal pain, diarrhea, dysuria, hematuria. The patient had a number of episodes of emesis without any blood. During his ED stay, the patient's abdominal pain became more right upper quadrant. CT of the abdomen and pelvis was obtained and showed mildly distended gallbladder with dependent gallstones. There was hepatosplenomegaly. There was fluid filled but normal caliber bowel loops without inflammation. It was diverticulosis without diverticulitis.  In the emergency department, the patient had a temperature of 99.8. He was tachycardic up to 120. Blood pressure was 144/90. Oxygen saturation 94% on room air. WBC was 13.3. AST 28, ALT 47, alk phosphatase 65, total bilirubin 1.4. Lipase was 45. Point-of-care troponin was negative. Serum creatinine 1.14. Urinalysis was negative for pyuria. EKG shows sinus rhythm with minimal J-point elevation in V1-V3 Assessment/Plan: Abdominal pain associated with vomiting -Concerned about cholecystitis -RUQ ultrasound -general surgery has been consulted by ED physician -CT abdomen and pelvis--mildly distended gallbladder with dependent gallstones. There was hepatosplenomegaly. There  was fluid filled but normal caliber bowel loops without inflammation. It was diverticulosis without diverticulitis. -Start the patient on aztreonam and Flagyl (anaphylaxis to penicillin, intolerance to levofloxacin) -Blood cultures 2 sets -Start IV fluids -Lactic acid -keep npo -IV opioids and antiemetics Diabetes mellitus type 2 -Continue glipizide and metformin for now -Hemoglobin A1c -NovoLog sliding scale Hypertension -Hold lisinopril since pt is npo -Hydralazine IV prn SBP >180 Hyperlipidemia -Hold statin until patient is able to take po        Past Medical History  Diagnosis Date  . GERD (gastroesophageal reflux disease)   . Diabetes mellitus   . Hypertension   . Renal disorder     Kidney Stones   History reviewed. No pertinent past surgical history. Social History:  reports that he has quit smoking. He does not have any smokeless tobacco history on file. He reports that he drinks alcohol. He reports that he does not use illicit drugs.   History reviewed. No pertinent family history.   Allergies  Allergen Reactions  . Penicillins Anaphylaxis  . Levaquin [Levofloxacin In D5w] Other (See Comments)    Severe Tendonitis  . Erythromycin Rash    Azithromycin okay      Prior to Admission medications   Medication Sig Start Date End Date Taking? Authorizing Provider  atorvastatin (LIPITOR) 40 MG tablet Take 40 mg by mouth daily.   Yes Historical Provider, MD  glipiZIDE (GLUCOTROL) 5 MG tablet Take 5 mg by mouth 2 (two) times daily before a meal.   Yes Historical Provider, MD  lisinopril (PRINIVIL,ZESTRIL) 10 MG tablet Take 10 mg by mouth every morning.   Yes Historical Provider, MD  metFORMIN (GLUCOPHAGE) 500 MG tablet Take 1,000 mg by mouth 2 (two) times daily with a meal.   Yes Historical  Provider, MD  Multiple Vitamins-Minerals (DIABETES HEALTH FORMULA PO) Take 1 tablet by mouth daily.   Yes Historical Provider, MD  omeprazole (PRILOSEC) 20 MG capsule Take 20  mg by mouth daily.   Yes Historical Provider, MD  Specialty Vitamins Products (VITA-RX DIABETIC VITAMIN PO) Take 5 tablets by mouth daily.   Yes Historical Provider, MD    Review of Systems:  Constitutional:  No weight loss, night sweats, Fevers, chills Head&Eyes: No headache.  No vision loss.  No eye pain or scotoma ENT:  No Difficulty swallowing,Tooth/dental problems,Sore throat,   Cardio-vascular:  No chest pain, Orthopnea, PND, swelling in lower extremities,  dizziness, palpitations  GI:  Nodiarrhea, loss of appetite, hematochezia, melena, heartburn, indigestion, Resp:  No shortness of breath with exertion or at rest. No cough. No coughing up of blood .No wheezing.No chest wall deformity  Skin:  no rash or lesions.  GU:  no dysuria, change in color of urine, no urgency or frequency. No flank pain.  Musculoskeletal:  No joint pain or swelling. No decreased range of motion. No back pain.  Psych:  No change in mood or affect.  Neurologic: No headache, no dysesthesia, no focal weakness, no vision loss. No syncope  Physical Exam: Filed Vitals:   02/28/15 1850 02/28/15 2117 02/28/15 2359  BP: 144/90 147/83 155/89  Pulse: 85 88 101  Temp: 97.8 F (36.6 C)  99.8 F (37.7 C)  TempSrc: Oral  Oral  Resp: 14 16 120  SpO2: 94% 94% 94%   General:  A&O x 3, NAD, nontoxic, pleasant/cooperative Head/Eye: No conjunctival hemorrhage, no icterus, [place]/AT, No nystagmus ENT:  No icterus,  No thrush, good dentition, no pharyngeal exudate Neck:  No masses, no lymphadenpathy, no bruits CV:  RRR, no rub, no gallop, no S3 Lung:  CTAB, good air movement, no wheeze, no rhonchi Abdomen: soft/RUQ tender without rebound,+guarding +BS, nondistended, no peritoneal signs Ext: No cyanosis, No rashes, No petechiae, No lymphangitis, No edema   Labs on Admission:  Basic Metabolic Panel:  Recent Labs Lab 02/28/15 1846  NA 138  K 4.1  CL 103  CO2 23  GLUCOSE 279*  BUN 18  CREATININE 1.14    CALCIUM 9.2   Liver Function Tests:  Recent Labs Lab 02/28/15 1846  AST 28  ALT 47  ALKPHOS 65  BILITOT 1.4*  PROT 7.6  ALBUMIN 4.8    Recent Labs Lab 02/28/15 1846  LIPASE 45   No results for input(s): AMMONIA in the last 168 hours. CBC:  Recent Labs Lab 02/28/15 1846  WBC 13.3*  NEUTROABS 11.6*  HGB 16.1  HCT 47.3  MCV 81.7  PLT 209   Cardiac Enzymes: No results for input(s): CKTOTAL, CKMB, CKMBINDEX, TROPONINI in the last 168 hours. BNP: Invalid input(s): POCBNP CBG: No results for input(s): GLUCAP in the last 168 hours.  Radiological Exams on Admission: Ct Abdomen Pelvis W Contrast  02/28/2015   CLINICAL DATA:  Upper abdominal pain, nausea and vomiting since this afternoon.  EXAM: CT ABDOMEN AND PELVIS WITH CONTRAST  TECHNIQUE: Multidetector CT imaging of the abdomen and pelvis was performed using the standard protocol following bolus administration of intravenous contrast.  CONTRAST:  173mL OMNIPAQUE IOHEXOL 300 MG/ML  SOLN  COMPARISON:  None.  FINDINGS: Minimal atelectasis in the lingula.  Lung bases are otherwise clear.  The liver is enlarged measuring 24.3 cm in craniocaudal dimension with diffusely decreased density. There is no focal hepatic lesion. Gallbladder is minimally distended, suspect small dependent gallstones. No  gallbladder wall thickening. No biliary dilatation. The spleen is enlarged measuring 18.4 x 15.1 x 7.3 cm. No focal splenic lesion.  Pancreas, adrenal glands, and kidneys are normal. Symmetric renal enhancement and excretion.  Stomach is mildly distended with ingested contents. Fluid distending normal caliber small bowel loops. Minimal fluid in the cecum and ascending colon. Small volume of stool throughout the remainder of the colon. There is minimal diverticulosis of the distal colon without diverticulitis. No bowel wall thickening or dilatation. The appendix is normal. No free air, free fluid, or intra-abdominal fluid collection.  The  abdominal aorta is normal in caliber. No retroperitoneal adenopathy.  In the pelvis the urinary bladder is physiologically distended. Prostate gland is normal in size. There is fat in the left inguinal canal. No pelvic free fluid or adenopathy. There is mild degenerative change at the lumbosacral and thoracolumbar junction in the spine. No acute or suspicious osseous abnormalities.  IMPRESSION: 1. Fluid-filled normal caliber bowel loops without associated bowel inflammation, may reflect mild enteritis. 2. Mild diverticulosis of the distal colon without diverticulitis. 3. Hepatosplenomegaly and hepatic steatosis. Probable small gallstones within physiologically distended gallbladder.   Electronically Signed   By: Jeb Levering M.D.   On: 02/28/2015 22:03    EKG: Independently reviewed. Sinus, J-point elevation V1-V3    Time spent:60 minutes Code Status:   FULL Family Communication:   Wife updated at bedside   Joseph Riva, DO  [facility] Pager [phone]  If 7PM-7AM, please contact night-coverage [email or web address] Password [email or web address] 03/01/2015, 1:46 AM

## 2015-03-01 NOTE — Anesthesia Procedure Notes (Signed)
Procedure Name: Intubation Date/Time: 03/01/2015 3:55 PM Performed by: Early OsmondEARGLE, Dandra Velardi E Pre-anesthesia Checklist: Patient identified, Emergency Drugs available, Suction available and Patient being monitored Patient Re-evaluated:Patient Re-evaluated prior to inductionOxygen Delivery Method: Circle System Utilized Preoxygenation: Pre-oxygenation with 100% oxygen Intubation Type: IV induction Ventilation: Mask ventilation without difficulty Laryngoscope Size: Miller and 3 Grade View: Grade I Tube type: Oral Tube size: 7.5 mm Number of attempts: 1 Airway Equipment and Method: Stylet and Oral airway Placement Confirmation: ETT inserted through vocal cords under direct vision,  positive ETCO2 and breath sounds checked- equal and bilateral Secured at: 22 cm Tube secured with: Tape Dental Injury: Teeth and Oropharynx as per pre-operative assessment

## 2015-03-02 ENCOUNTER — Encounter (HOSPITAL_COMMUNITY): Payer: Self-pay | Admitting: Surgery

## 2015-03-02 ENCOUNTER — Inpatient Hospital Stay (HOSPITAL_COMMUNITY): Payer: 59

## 2015-03-02 DIAGNOSIS — K812 Acute cholecystitis with chronic cholecystitis: Secondary | ICD-10-CM

## 2015-03-02 LAB — COMPREHENSIVE METABOLIC PANEL
ALT: 230 U/L — ABNORMAL HIGH (ref 0–53)
ANION GAP: 7 (ref 5–15)
AST: 182 U/L — ABNORMAL HIGH (ref 0–37)
Albumin: 3.5 g/dL (ref 3.5–5.2)
Alkaline Phosphatase: 85 U/L (ref 39–117)
BUN: 19 mg/dL (ref 6–23)
CO2: 29 mmol/L (ref 19–32)
CREATININE: 1.38 mg/dL — AB (ref 0.50–1.35)
Calcium: 7.8 mg/dL — ABNORMAL LOW (ref 8.4–10.5)
Chloride: 106 mmol/L (ref 96–112)
GFR calc non Af Amer: 60 mL/min — ABNORMAL LOW (ref >90)
GFR, EST AFRICAN AMERICAN: 69 mL/min — AB (ref >90)
GLUCOSE: 188 mg/dL — AB (ref 70–99)
Potassium: 4.6 mmol/L (ref 3.5–5.1)
Sodium: 142 mmol/L (ref 135–145)
TOTAL PROTEIN: 6 g/dL (ref 6.0–8.3)
Total Bilirubin: 1.2 mg/dL (ref 0.3–1.2)

## 2015-03-02 LAB — GLUCOSE, CAPILLARY
Glucose-Capillary: 322 mg/dL — ABNORMAL HIGH (ref 70–99)
Glucose-Capillary: 185 mg/dL — ABNORMAL HIGH (ref 70–99)
Glucose-Capillary: 190 mg/dL — ABNORMAL HIGH (ref 70–99)
Glucose-Capillary: 233 mg/dL — ABNORMAL HIGH (ref 70–99)

## 2015-03-02 LAB — CBC
HCT: 40.1 % (ref 39.0–52.0)
Hemoglobin: 13 g/dL (ref 13.0–17.0)
MCH: 27.7 pg (ref 26.0–34.0)
MCHC: 32.4 g/dL (ref 30.0–36.0)
MCV: 85.5 fL (ref 78.0–100.0)
Platelets: 171 10*3/uL (ref 150–400)
RBC: 4.69 MIL/uL (ref 4.22–5.81)
RDW: 14.4 % (ref 11.5–15.5)
WBC: 5.2 10*3/uL (ref 4.0–10.5)

## 2015-03-02 LAB — HEMOGLOBIN A1C
Hgb A1c MFr Bld: 10.9 % — ABNORMAL HIGH (ref 4.8–5.6)
Mean Plasma Glucose: 266 mg/dL

## 2015-03-02 MED ORDER — IBUPROFEN 600 MG PO TABS
600.0000 mg | ORAL_TABLET | Freq: Once | ORAL | Status: AC
  Administered 2015-03-02: 600 mg via ORAL
  Filled 2015-03-02: qty 3
  Filled 2015-03-02: qty 1

## 2015-03-02 NOTE — Progress Notes (Signed)
PROGRESS NOTE  Joseph McgregorStephen Lamour ZOX:[id number] DOB: 1966-03-15 DOA: 02/28/2015 PCP: Lesia HausenKUMMER,ANTHONY J, MD  HPI: 49 year old male with a history hyperlipidemia, diabetes mellitus, and hypertension presents with one-day history of colicky abdominal pain. 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 He was found to have acute cholecystitis s/p lap chole 3/24.  Subjective / 24 H Interval events - complains of shoulder pain, abdominal pain - no appetite but will try to eat   Assessment/Plan: Principal Problem:   Acute cholecystitis s/p lap chole 03/01/2015 Active Problems:   Type 2 diabetes mellitus with hyperglycemia   Essential hypertension   Hyperlipidemia  Abdominal pain associated with vomiting due to acute cholecystitis - s/p lap chole 3/24 - doing well post op, diet as tolerated - pain control - ambulate in the hallway  - home when pain better and able to eat - LFT elevation after surgery   Diabetes mellitus type 2 - Hemoglobin A1c pending - NovoLog sliding scale  Hypertension - Hold lisinopril - Hydralazine IV prn SBP >180  Hyperlipidemia - Hold statin for now   Diet: Diet - low sodium heart healthy Diet Carb Modified Fluid consistency:: Thin; Room service appropriate?: Yes Fluids: NS 100 cc/h DVT Prophylaxis: Lovenox  Code Status: Full Code Family Communication: none bedside  Disposition Plan: inpatient  Consultants:  General surgery   Procedures:  None    Antibiotics Aztreonam 3/24 >> Flagyl 3/24 >>   Studies  Nm Hepatobiliary Including Gb  03/01/2015   CLINICAL DATA:  Evaluate for acute cholecystitis. Increased liver function test, gallstones and abdominal pain.  EXAM: NUCLEAR MEDICINE HEPATOBILIARY IMAGING  TECHNIQUE: Sequential images of the abdomen were obtained out to 60 minutes following intravenous administration of radiopharmaceutical.  RADIOPHARMACEUTICALS:  6.0 Millicurie Tc-1669m Choletec  COMPARISON:  None.  FINDINGS: Following  the IV administration of the radiopharmaceutical there is uniform tracer uptake by the liver with clearance from blood pool. After 20 minutes there is activity in the common bile duct and small bowel. After 60 minutes there is no radiotracer activity identified within the gallbladder. 6 mg of morphine sulfate was administered intravenously and subsequent imaging was carried out for an additional 1 hour. No radiotracer activity identified within the gallbladder on delayed imaging and following the IV administration of morphine.  IMPRESSION: 1. Non visualization of the gallbladder. Findings compatible with cystic duct obstruction and cholecystitis.   Electronically Signed   By: Signa Kellaylor  Stroud M.D.   On: 03/01/2015 13:57   Ct Abdomen Pelvis W Contrast  02/28/2015   CLINICAL DATA:  Upper abdominal pain, nausea and vomiting since this afternoon.  EXAM: CT ABDOMEN AND PELVIS WITH CONTRAST  TECHNIQUE: Multidetector CT imaging of the abdomen and pelvis was performed using the standard protocol following bolus administration of intravenous contrast.  CONTRAST:  100mL OMNIPAQUE IOHEXOL 300 MG/ML  SOLN  COMPARISON:  None.  FINDINGS: Minimal atelectasis in the lingula.  Lung bases are otherwise clear.  The liver is enlarged measuring 24.3 cm in craniocaudal dimension with diffusely decreased density. There is no focal hepatic lesion. Gallbladder is minimally distended, suspect small dependent gallstones. No gallbladder wall thickening. No biliary dilatation. The spleen is enlarged measuring 18.4 x 15.1 x 7.3 cm. No focal splenic lesion.  Pancreas, adrenal glands, and kidneys are normal. Symmetric renal enhancement and excretion.  Stomach is mildly distended with ingested contents. Fluid distending normal caliber small bowel loops. Minimal fluid in the cecum and ascending colon. Small volume of stool throughout the remainder of the colon.  There is minimal diverticulosis of the distal colon without diverticulitis. No bowel  wall thickening or dilatation. The appendix is normal. No free air, free fluid, or intra-abdominal fluid collection.  The abdominal aorta is normal in caliber. No retroperitoneal adenopathy.  In the pelvis the urinary bladder is physiologically distended. Prostate gland is normal in size. There is fat in the left inguinal canal. No pelvic free fluid or adenopathy. There is mild degenerative change at the lumbosacral and thoracolumbar junction in the spine. No acute or suspicious osseous abnormalities.  IMPRESSION: 1. Fluid-filled normal caliber bowel loops without associated bowel inflammation, may reflect mild enteritis. 2. Mild diverticulosis of the distal colon without diverticulitis. 3. Hepatosplenomegaly and hepatic steatosis. Probable small gallstones within physiologically distended gallbladder.   Electronically Signed   By: Rubye Oaks M.D.   On: 02/28/2015 22:03   US Abdomen Limited Ruq  03/01/2015   CLINICAL DATA:  Acute onset of right upper quadrant abdominal pain and vomiting. Initial encounter.  EXAM: US ABDOMEN LIMITED - RIGHT UPPER QUADRANT  COMPARISON:  None.  FINDINGS: Gallbladder:  A wall echo shadow sign is noted, with stones seen filling the gallbladder. No significant gallbladder wall thickening or pericholecystic fluid is seen. No ultrasonographic Murphy's sign is elicited, though this is not well assessed as the patient is on pain medication.  Common bile duct:  Diameter: 0.5 cm, within normal limits in caliber.  Liver:  No focal lesion identified. Diffusely increased parenchymal echogenicity and coarsened echotexture, compatible with fatty infiltration.  IMPRESSION: 1. Wall echo shadow sign noted, with stones filling the gallbladder. No definite evidence for obstruction or cholecystitis, though the gallbladder is difficult to fully assess assess given stones. 2. Diffuse fatty infiltration within the liver.   Electronically Signed   By: Roanna Raider M.D.   On: 03/01/2015 02:11    Objective  Filed Vitals:   03/01/15 2141 03/01/15 2241 03/02/15 0200 03/02/15 0600  BP: 143/74 121/76 124/78 151/91  Pulse: 79 78 65 74  Temp: 98.4 F (36.9 C) 98.8 F (37.1 C) 98.2 F (36.8 C) 98.5 F (36.9 C)  TempSrc: Oral Oral Oral Oral  Resp: Height:      Weight:      SpO2: 98% 98% 97% 97%    Intake/Output Summary (Last 24 hours) at 03/02/15 0959 Last data filed at 03/02/15 0500  Gross per 24 hour  Intake 3197.09 ml  Output    401 ml  Net 2796.09 ml   Filed Weights   03/01/15 0210  Weight: 147.328 kg (324 lb 12.8 oz)   Exam:  General:  No apparent distress  HEENT: no scleral icterus  Cardiovascular: RRR without MRG  Respiratory: CTA biL, good air movement, no wheezing, no crackles, no rales  Abdomen: soft, mild tenderness  MSK/Extremities: no clubbing/cyanosis, no joint swelling  Data Reviewed: Basic Metabolic Panel:  Recent Labs Lab 02/28/15 1846 03/01/15 0225 03/02/15 0420  NA 138 138 142  K 4.1 3.8 4.6  CL 103 103 106  CO2 GLUCOSE 279* 303* 188*  BUN CREATININE 1.14 1.15 1.38*  CALCIUM 9.2 8.1* 7.8*   Liver Function Tests:  Recent Labs Lab 02/28/15 1846 03/01/15 0225 03/02/15 0420  AST 28 21 182*  ALT 47 40 230*  ALKPHOS 65 56 85  BILITOT 1.4* 1.6* 1.2  PROT 7.6 6.7 6.0  ALBUMIN 4.8 4.1 3.5    Recent Labs Lab 02/28/15 1846  LIPASE 45  CBC:  Recent Labs Lab 02/28/15 1846 03/02/15 0420  WBC 13.3* 5.2  NEUTROABS 11.6*  --   HGB 16.1 13.0  HCT 47.3 40.1  MCV 81.7 85.5  PLT 209 171   CBG:  Recent Labs Lab 03/01/15 0745 03/01/15 1409 03/01/15 1834 03/01/15 2207 03/02/15 0737  GLUCAP 196* 206* 322* 292* 185*   Scheduled Meds: . acetaminophen  1,000 mg Oral TID  . aztreonam  2 g Intravenous 3 times per day  . enoxaparin (LOVENOX) injection  40 mg Subcutaneous Q24H  . insulin aspart  0-20 Units Subcutaneous TID WC  . insulin aspart  0-5 Units Subcutaneous QHS  .  metronidazole  500 mg Intravenous 3 times per day   Continuous Infusions: . sodium chloride 1,000 mL (03/01/15 2045)  . lactated ringers 1,000 mL (03/01/15 1536)  . sodium chloride 0.9 % 1,000 mL with potassium chloride 20 mEq infusion Stopped (03/01/15 1535)    Pamella Pert, MD [facility] Pager [phone]. If 7 PM - 7 AM, please contact night-coverage at [email or web address], password [email or web address] 03/02/2015, 9:59 AM  LOS: 1 day

## 2015-03-02 NOTE — Progress Notes (Signed)
General Surgery Note  LOS: 1 day  POD -  1 Day Post-Op  Assessment/Plan: 1.  LAPAROSCOPIC CHOLECYSTECTOMY SINGLE SITE AND CORE LIVER BIOPSY  - 03/01/2015 - Joseph [name]  The patient fell off the OR tableq  Not sure which are her symptoms are from gall bladder and which are from fall  He does not think that he is ready to go home.  2.  DVT prophylaxis - Lovenox 3.  Mildly elevated Creat - 1.38 - 03/02/2015 4.  HTN 5.  DM   Principal Problem:   Acute cholecystitis s/p lap chole 03/01/2015 Active Problems:   Type 2 diabetes mellitus with hyperglycemia   Essential hypertension   Hyperlipidemia   Subjective:  Alert.  Sore left shoulder, left abdomen, and right abdomen.  Wife in room.  He works as a Radiation protection practitionerparamedic.  Dr. Michaell CowingGross has already written him to be out of work until May - and see Gross back to see if this needs to be adjusted. Objective:   Filed Vitals:   03/02/15 0600  BP: 151/91  Pulse: 74  Temp: 98.5 F (36.9 C)  Resp: 16     Intake/Output from previous day:  03/24 0701 - 03/25 0700 In: 3197.1 [P.O.:660; I.V.:2387.1; IV Piggyback:150] Out: 401 [Urine:400; Stool:1]  Intake/Output this shift:      Physical Exam:   General: Large WM who is alert and oriented.    HEENT: Normal. Pupils equal. .   Lungs: Clear.  IS about 1,200 cc   Abdomen: Soft.  Few BS.   Wound: Clean.  Some redness below umbilical incision.   Lab Results:    Recent Labs  02/28/15 1846 03/02/15 0420  WBC 13.3* 5.2  HGB 16.1 13.0  HCT 47.3 40.1  PLT 209 171    BMET   Recent Labs  03/01/15 0225 03/02/15 0420  NA 138 142  K 3.8 4.6  CL 103 106  CO2 24 29  GLUCOSE 303* 188*  BUN 21 19  CREATININE 1.15 1.38*  CALCIUM 8.1* 7.8*    PT/INR  No results for input(s): LABPROT, INR in the last 72 hours.  ABG  No results for input(s): PHART, HCO3 in the last 72 hours.  Invalid input(s): PCO2, PO2   Studies/Results:  Nm Hepatobiliary Including Gb  03/01/2015   CLINICAL DATA:  Evaluate for  acute cholecystitis. Increased liver function test, gallstones and abdominal pain.  EXAM: NUCLEAR MEDICINE HEPATOBILIARY IMAGING  TECHNIQUE: Sequential images of the abdomen were obtained out to 60 minutes following intravenous administration of radiopharmaceutical.  RADIOPHARMACEUTICALS:  6.0 Millicurie Tc-1251m Choletec  COMPARISON:  None.  FINDINGS: Following the IV administration of the radiopharmaceutical there is uniform tracer uptake by the liver with clearance from blood pool. After 20 minutes there is activity in the common bile duct and small bowel. After 60 minutes there is no radiotracer activity identified within the gallbladder. 6 mg of morphine sulfate was administered intravenously and subsequent imaging was carried out for an additional 1 hour. No radiotracer activity identified within the gallbladder on delayed imaging and following the IV administration of morphine.  IMPRESSION: 1. Non visualization of the gallbladder. Findings compatible with cystic duct obstruction and cholecystitis.   Electronically Signed   By: Signa Kellaylor  Stroud M.D.   On: 03/01/2015 13:57   Ct Abdomen Pelvis W Contrast  02/28/2015   CLINICAL DATA:  Upper abdominal pain, nausea and vomiting since this afternoon.  EXAM: CT ABDOMEN AND PELVIS WITH CONTRAST  TECHNIQUE: Multidetector CT imaging of the abdomen  and pelvis was performed using the standard protocol following bolus administration of intravenous contrast.  CONTRAST:  OMNIPAQUE IOHEXOL 300 MG/ML  SOLN  COMPARISON:  None.  FINDINGS: Minimal atelectasis in the lingula.  Lung bases are otherwise clear.  The liver is enlarged measuring 24.3 cm in craniocaudal dimension with diffusely decreased density. There is no focal hepatic lesion. Gallbladder is minimally distended, suspect small dependent gallstones. No gallbladder wall thickening. No biliary dilatation. The spleen is enlarged measuring 18.4 x 15.1 x 7.3 cm. No focal splenic lesion.  Pancreas, adrenal glands, and  kidneys are normal. Symmetric renal enhancement and excretion.  Stomach is mildly distended with ingested contents. Fluid distending normal caliber small bowel loops. Minimal fluid in the cecum and ascending colon. Small volume of stool throughout the remainder of the colon. There is minimal diverticulosis of the distal colon without diverticulitis. No bowel wall thickening or dilatation. The appendix is normal. No free air, free fluid, or intra-abdominal fluid collection.  The abdominal aorta is normal in caliber. No retroperitoneal adenopathy.  In the pelvis the urinary bladder is physiologically distended. Prostate gland is normal in size. There is fat in the left inguinal canal. No pelvic free fluid or adenopathy. There is mild degenerative change at the lumbosacral and thoracolumbar junction in the spine. No acute or suspicious osseous abnormalities.  IMPRESSION: 1. Fluid-filled normal caliber bowel loops without associated bowel inflammation, may reflect mild enteritis. 2. Mild diverticulosis of the distal colon without diverticulitis. 3. Hepatosplenomegaly and hepatic steatosis. Probable small gallstones within physiologically distended gallbladder.   Electronically Signed   By: Rubye Oaks M.D.   On: 02/28/2015 22:03   US Abdomen Limited Ruq  03/01/2015   CLINICAL DATA:  Acute onset of right upper quadrant abdominal pain and vomiting. Initial encounter.  EXAM: US ABDOMEN LIMITED - RIGHT UPPER QUADRANT  COMPARISON:  None.  FINDINGS: Gallbladder:  A wall echo shadow sign is noted, with stones seen filling the gallbladder. No significant gallbladder wall thickening or pericholecystic fluid is seen. No ultrasonographic Murphy's sign is elicited, though this is not well assessed as the patient is on pain medication.  Common bile duct:  Diameter: 0.5 cm, within normal limits in caliber.  Liver:  No focal lesion identified. Diffusely increased parenchymal echogenicity and coarsened echotexture, compatible  with fatty infiltration.  IMPRESSION: 1. Wall echo shadow sign noted, with stones filling the gallbladder. No definite evidence for obstruction or cholecystitis, though the gallbladder is difficult to fully assess assess given stones. 2. Diffuse fatty infiltration within the liver.   Electronically Signed   By: Roanna Raider M.D.   On: 03/01/2015 02:11     Anti-infectives:   Anti-infectives    Start     Dose/Rate Route Frequency Ordered Stop   03/01/15 0600  aztreonam (AZACTAM) injection 2 g  Status:  Discontinued     2 g Intramuscular 3 times per day 03/01/15 0203 03/01/15 0207   03/01/15 0500  aztreonam (AZACTAM) 2 g in dextrose 5 % 50 mL IVPB     2 g 100 mL/hr over 30 Minutes Intravenous 3 times per day 03/01/15 0208     03/01/15 0300  metroNIDAZOLE (FLAGYL) IVPB 500 mg     500 mg 100 mL/hr over 60 Minutes Intravenous 3 times per day 03/01/15 0203        Ovidio Kin, MD, FACS Pager: [phone] [facility] Office: [phone] 03/02/2015

## 2015-03-02 NOTE — Progress Notes (Addendum)
Inpatient Diabetes Program Recommendations  AACE/ADA: New Consensus Statement on Inpatient Glycemic Control (2013)  Target Ranges:  Prepandial:   less than 140 mg/dL      Peak postprandial:   less than 180 mg/dL (1-2 hours)      Critically ill patients:  140 - 180 mg/dL     Results for Joseph [name], Joseph [name] (MRN [id number]) as of 03/02/2015 14:12  Ref. Range 03/02/2015 07:37 03/02/2015 12:00  Glucose-Capillary Latest Range: 70-99 mg/dL 409185 (H) 811190 (H)    Results for Joseph [name], Joseph [name] (MRN [id number]) as of 03/02/2015 14:12  Ref. Range 03/01/2015 02:25  Hemoglobin A1C Latest Range: 4.8-5.6 % 10.9 (H)    Admit with: Cholecystitis  History: DM, HTN  Home DM Meds: Glipizide 5 mg bid       Metformin 1000 mg  bid  Current DM Orders: Novolog Resistant SSI tid ac + HS    **Spoke with patient and his wife about pt's elevated A1c of 10.9%.  Patient was shocked to hear that his A1c was so high and stated his CBGs are never usually elevated >180 mg/dl at home.  Per pt, checks CBGs bid.  Patient told me he saw his Endocrinologist (Dr. Nonie Hoyerantley with [facility] [place] back in February.  Could not remember if an A1c was checked at that visit or not.  Per patient, his Endo told him he needed to "work on getting his A1c down".  Patient knows what an A1c measures and knows his A1c goal should be 7% or less.  Patient stated he has trying to "eat better".  **When reviewing patient's chart in Care Everywhere, I was able to read MD note from patient's visit with his Endo on 02/01/15.  Per that note, patient's A1c was 11.4% back on 01/02/15.  Pt's Endo wanted pt to check his CBGs more often and discussed starting insulin versus SGLT-2 drug with patient to get pt's A1c down.  Patient has not yet started insulin or SGLT-2 drug yet.  I believe the plan is for patient to follow-up with his ENDO soon after d/c.  **Encouraged pt to go see his ENDO within the next two weeks after d/c for follow-up on his A1c.  Patient  and wife very agreeable and appreciative of my visit with them.    Will follow Ambrose FinlandJeannine Johnston Calbert Hulsebus RN, MSN, CDE Diabetes Coordinator Inpatient Diabetes Program Team Pager: [phone] (8a-5p)

## 2015-03-03 DIAGNOSIS — R1084 Generalized abdominal pain: Secondary | ICD-10-CM

## 2015-03-03 LAB — COMPREHENSIVE METABOLIC PANEL
ALK PHOS: 99 U/L (ref 39–117)
ALT: 180 U/L — ABNORMAL HIGH (ref 0–53)
AST: 82 U/L — ABNORMAL HIGH (ref 0–37)
Albumin: 3.7 g/dL (ref 3.5–5.2)
Anion gap: 9 (ref 5–15)
BILIRUBIN TOTAL: 1.8 mg/dL — AB (ref 0.3–1.2)
BUN: 13 mg/dL (ref 6–23)
CALCIUM: 8 mg/dL — AB (ref 8.4–10.5)
CHLORIDE: 102 mmol/L (ref 96–112)
CO2: 25 mmol/L (ref 19–32)
Creatinine, Ser: 0.94 mg/dL (ref 0.50–1.35)
GFR calc Af Amer: >90 mL/min (ref >90)
GLUCOSE: 212 mg/dL — AB (ref 70–99)
Potassium: 3.8 mmol/L (ref 3.5–5.1)
Sodium: 136 mmol/L (ref 135–145)
TOTAL PROTEIN: 6.3 g/dL (ref 6.0–8.3)

## 2015-03-03 LAB — GLUCOSE, CAPILLARY
GLUCOSE-CAPILLARY: 236 mg/dL — AB (ref 70–99)
GLUCOSE-CAPILLARY: 219 mg/dL — AB (ref 70–99)
GLUCOSE-CAPILLARY: 211 mg/dL — AB (ref 70–99)
GLUCOSE-CAPILLARY: 194 mg/dL — AB (ref 70–99)
Glucose-Capillary: 171 mg/dL — ABNORMAL HIGH (ref 70–99)

## 2015-03-03 LAB — HEMOGLOBIN A1C
Hgb A1c MFr Bld: 11 % — ABNORMAL HIGH (ref 4.8–5.6)
MEAN PLASMA GLUCOSE: 269 mg/dL

## 2015-03-03 MED ORDER — PANTOPRAZOLE SODIUM 40 MG PO TBEC
80.0000 mg | DELAYED_RELEASE_TABLET | Freq: Every day | ORAL | Status: DC
  Administered 2015-03-03: 80 mg via ORAL
  Filled 2015-03-03 (×2): qty 2

## 2015-03-03 MED ORDER — FENTANYL CITRATE 0.05 MG/ML IJ SOLN
25.0000 ug | INTRAMUSCULAR | Status: DC | PRN
Start: 2015-03-03 — End: 2015-03-07
  Administered 2015-03-03 (×3): 25 ug via INTRAVENOUS
  Administered 2015-03-06 (×2): 50 ug via INTRAVENOUS
  Administered 2015-03-06 (×2): 25 ug via INTRAVENOUS
  Filled 2015-03-03 (×7): qty 2

## 2015-03-03 MED ORDER — PROMETHAZINE HCL 25 MG/ML IJ SOLN: 6.2500 mg | INTRAMUSCULAR | Status: DC | PRN

## 2015-03-03 MED ORDER — MAGIC MOUTHWASH
15.0000 mL | Freq: Four times a day (QID) | ORAL | Status: DC | PRN
  Filled 2015-03-03: qty 15

## 2015-03-03 MED ORDER — KETOROLAC TROMETHAMINE 15 MG/ML IJ SOLN
30.0000 mg | Freq: Four times a day (QID) | INTRAMUSCULAR | Status: AC
  Administered 2015-03-03 (×3): 30 mg via INTRAVENOUS
  Filled 2015-03-03 (×3): qty 2

## 2015-03-03 MED ORDER — ONDANSETRON 8 MG/NS 50 ML IVPB
8.0000 mg | Freq: Four times a day (QID) | INTRAVENOUS | Status: DC | PRN
  Filled 2015-03-03: qty 8

## 2015-03-03 MED ORDER — TRAMADOL HCL 50 MG PO TABS: 50.0000 mg | ORAL_TABLET | Freq: Four times a day (QID) | ORAL | Status: DC | PRN

## 2015-03-03 MED ORDER — INSULIN GLARGINE 100 UNIT/ML ~~LOC~~ SOLN
5.0000 [IU] | Freq: Every day | SUBCUTANEOUS | Status: DC
  Administered 2015-03-03 – 2015-03-06 (×4): 5 [IU] via SUBCUTANEOUS
  Filled 2015-03-03 (×4): qty 0.05

## 2015-03-03 MED ORDER — DEXTROSE 5 % IV SOLN
2.0000 g | Freq: Three times a day (TID) | INTRAVENOUS | Status: DC
  Administered 2015-03-03 – 2015-03-07 (×12): 2 g via INTRAVENOUS
  Filled 2015-03-03 (×13): qty 2

## 2015-03-03 MED ORDER — MENTHOL 3 MG MT LOZG
1.0000 | LOZENGE | OROMUCOSAL | Status: DC | PRN
  Filled 2015-03-03: qty 9

## 2015-03-03 MED ORDER — LORAZEPAM 2 MG/ML IJ SOLN
0.5000 mg | Freq: Three times a day (TID) | INTRAMUSCULAR | Status: DC | PRN
Start: 2015-03-03 — End: 2015-03-07

## 2015-03-03 MED ORDER — GLIPIZIDE 5 MG PO TABS
5.0000 mg | ORAL_TABLET | Freq: Two times a day (BID) | ORAL | Status: DC
  Filled 2015-03-03 (×3): qty 1

## 2015-03-03 MED ORDER — ATORVASTATIN CALCIUM 40 MG PO TABS
40.0000 mg | ORAL_TABLET | Freq: Every day | ORAL | Status: DC
  Administered 2015-03-04 – 2015-03-06 (×3): 40 mg via ORAL
  Filled 2015-03-03 (×5): qty 1

## 2015-03-03 MED ORDER — METHOCARBAMOL 1000 MG/10ML IJ SOLN
1000.0000 mg | Freq: Four times a day (QID) | INTRAVENOUS | Status: DC | PRN
  Administered 2015-03-03: 1000 mg via INTRAVENOUS
  Filled 2015-03-03 (×3): qty 10

## 2015-03-03 MED ORDER — ENOXAPARIN SODIUM 60 MG/0.6ML ~~LOC~~ SOLN
60.0000 mg | SUBCUTANEOUS | Status: DC
  Administered 2015-03-03 – 2015-03-05 (×3): 60 mg via SUBCUTANEOUS
  Filled 2015-03-03 (×5): qty 0.6

## 2015-03-03 MED ORDER — LIP MEDEX EX OINT
1.0000 "application " | TOPICAL_OINTMENT | Freq: Two times a day (BID) | CUTANEOUS | Status: DC
  Administered 2015-03-03 – 2015-03-07 (×9): 1 via TOPICAL

## 2015-03-03 MED ORDER — DIPHENHYDRAMINE HCL 50 MG/ML IJ SOLN: 12.5000 mg | Freq: Four times a day (QID) | INTRAMUSCULAR | Status: DC | PRN

## 2015-03-03 MED ORDER — METOCLOPRAMIDE HCL 5 MG/ML IJ SOLN: 5.0000 mg | Freq: Four times a day (QID) | INTRAMUSCULAR | Status: DC | PRN

## 2015-03-03 MED ORDER — ONDANSETRON HCL 4 MG/2ML IJ SOLN
4.0000 mg | Freq: Four times a day (QID) | INTRAMUSCULAR | Status: DC | PRN
  Administered 2015-03-03: 4 mg via INTRAVENOUS
  Filled 2015-03-03: qty 2

## 2015-03-03 MED ORDER — ALUM & MAG HYDROXIDE-SIMETH 200-200-20 MG/5ML PO SUSP
30.0000 mL | Freq: Once | ORAL | Status: AC
  Administered 2015-03-03: 30 mL via ORAL

## 2015-03-03 MED ORDER — METFORMIN HCL 500 MG PO TABS
1000.0000 mg | ORAL_TABLET | Freq: Two times a day (BID) | ORAL | Status: DC
  Administered 2015-03-04 – 2015-03-07 (×7): 1000 mg via ORAL
  Filled 2015-03-03 (×12): qty 2

## 2015-03-03 MED ORDER — KETOROLAC TROMETHAMINE 15 MG/ML IJ SOLN: 15.0000 mg | Freq: Four times a day (QID) | INTRAMUSCULAR | Status: DC | PRN

## 2015-03-03 MED ORDER — KETOROLAC TROMETHAMINE 15 MG/ML IJ SOLN
15.0000 mg | Freq: Four times a day (QID) | INTRAMUSCULAR | Status: DC | PRN
  Administered 2015-03-04 – 2015-03-05 (×5): 30 mg via INTRAVENOUS
  Filled 2015-03-03 (×5): qty 2

## 2015-03-03 MED ORDER — BISACODYL 10 MG RE SUPP
10.0000 mg | Freq: Every day | RECTAL | Status: DC
  Filled 2015-03-03: qty 1

## 2015-03-03 MED ORDER — LACTATED RINGERS IV BOLUS (SEPSIS): 1000.0000 mL | Freq: Three times a day (TID) | INTRAVENOUS | Status: AC | PRN

## 2015-03-03 MED ORDER — LIP MEDEX EX OINT
TOPICAL_OINTMENT | CUTANEOUS | Status: AC
  Administered 2015-03-03: 14:00:00
  Filled 2015-03-03: qty 7

## 2015-03-03 MED ORDER — ALUM & MAG HYDROXIDE-SIMETH 200-200-20 MG/5ML PO SUSP
30.0000 mL | Freq: Four times a day (QID) | ORAL | Status: DC | PRN
  Filled 2015-03-03: qty 30

## 2015-03-03 MED ORDER — OXYCODONE HCL 5 MG PO TABS
5.0000 mg | ORAL_TABLET | ORAL | Status: DC | PRN
  Filled 2015-03-03: qty 2

## 2015-03-03 MED ORDER — LACTATED RINGERS IV BOLUS (SEPSIS)
1000.0000 mL | Freq: Once | INTRAVENOUS | Status: AC
  Administered 2015-03-03: 1000 mL via INTRAVENOUS

## 2015-03-03 MED ORDER — PHENOL 1.4 % MT LIQD
2.0000 | OROMUCOSAL | Status: DC | PRN
  Filled 2015-03-03: qty 177

## 2015-03-03 NOTE — Progress Notes (Signed)
Pt c/o severe RUQ abd "cramping" pain, worsening on ambulation.  VSS.  Pt up to br; voiding w/o difficulty.  No c/o nausea; refuses breakfast this am.  Dr. Michaell CowingGross notified.

## 2015-03-03 NOTE — Progress Notes (Signed)
ANTIBIOTIC CONSULT NOTE - INITIAL  Pharmacy Consult for Aztreonam Indication: Cholecystitis, s/p cholecystectomy  Allergies  Allergen Reactions  . Penicillins Anaphylaxis  . Levaquin [Levofloxacin] Other (See Comments)    tendonitis  . Erythromycin Rash    Azithromycin okay   Patient Measurements: Height:  (190.5 cm) Weight: (!) 324 lb 12.8 oz (147.328 kg) IBW/kg (Calculated) : 84.5  Vital Signs: Temp: 99.7 F (37.6 C) (03/26 0923) Temp Source: Oral (03/26 0923) BP: 159/94 mmHg (03/26 0923) Pulse Rate: 88 (03/26 0923) Intake/Output from previous day: 03/25 0701 - 03/26 0700 In: 3767.5 [P.O.:1320; I.V.:2097.5; IV Piggyback:350] Out: 2350 [Urine:2350] Intake/Output from this shift:    Labs:  Recent Labs  02/28/15 1846 03/01/15 0225 03/02/15 0420 03/03/15 0445  WBC 13.3*  --  5.2  --   HGB 16.1  --  13.0  --   PLT 209  --  171  --   CREATININE 1.14 1.15 1.38* 0.94   Estimated Creatinine Clearance: 152.2 mL/min (by C-G formula based on Cr of 0.94). No results for input(s): VANCOTROUGH, VANCOPEAK, VANCORANDOM, GENTTROUGH, GENTPEAK, GENTRANDOM, TOBRATROUGH, TOBRAPEAK, TOBRARND, AMIKACINPEAK, AMIKACINTROU, AMIKACIN in the last 72 hours.   Microbiology: Recent Results (from the past 720 hour(s))  Culture, blood (routine x 2)     Status: None (Preliminary result)   Collection Time: 03/01/15  2:25 AM  Result Value Ref Range Status   Specimen Description BLOOD RIGHT ARM  Final   Special Requests BOTTLES DRAWN AEROBIC ONLY 5CC  Final   Culture   Final           BLOOD CULTURE RECEIVED NO GROWTH TO DATE CULTURE WILL BE HELD FOR 5 DAYS BEFORE ISSUING A FINAL NEGATIVE REPORT Performed at [facility]    Report Status PENDING  Incomplete  Culture, blood (routine x 2)     Status: None (Preliminary result)   Collection Time: 03/01/15  2:30 AM  Result Value Ref Range Status   Specimen Description BLOOD RIGHT FOREARM  Final   Special Requests BOTTLES DRAWN  AEROBIC ONLY 5CC  Final   Culture   Final           BLOOD CULTURE RECEIVED NO GROWTH TO DATE CULTURE WILL BE HELD FOR 5 DAYS BEFORE ISSUING A FINAL NEGATIVE REPORT Performed at [facility]    Report Status PENDING  Incomplete  Surgical pcr screen     Status: Abnormal   Collection Time: 03/01/15  2:44 PM  Result Value Ref Range Status   MRSA, PCR NEGATIVE NEGATIVE Final   Staphylococcus aureus POSITIVE (A) NEGATIVE Final    Comment:        The Xpert SA Assay (FDA approved for NASAL specimens in patients over 58 years of age), is one component of a comprehensive surveillance program.  Test performance has been validated by [facility] for patients greater than or equal to 47 year old. It is not intended to diagnose infection nor to guide or monitor treatment.    Medical History: Past Medical History  Diagnosis Date  . GERD (gastroesophageal reflux disease)   . Diabetes mellitus   . Hypertension   . Renal disorder     Kidney Stones   Medications:  Scheduled:  . acetaminophen  1,000 mg Oral TID  . atorvastatin  40 mg Oral q1800  . aztreonam  2 g Intravenous 3 times per day  . bisacodyl  10 mg Rectal Daily  . enoxaparin (LOVENOX) injection  60 mg Subcutaneous Q24H  . glipiZIDE  5 mg Oral BID AC  . insulin aspart  0-20 Units Subcutaneous TID WC  . insulin aspart  0-5 Units Subcutaneous QHS  . ketorolac  30 mg Intravenous 4 times per day  . lip balm  1 application Topical BID  . metFORMIN  1,000 mg Oral BID WC  . pantoprazole  80 mg Oral Daily   Anti-infectives    Start     Dose/Rate Route Frequency Ordered Stop   03/03/15 1400  aztreonam (AZACTAM) 2 g in dextrose 5 % 50 mL IVPB     2 g 100 mL/hr over 30 Minutes Intravenous 3 times per day 03/03/15 1006     03/01/15 0600  aztreonam (AZACTAM) injection 2 g  Status:  Discontinued     2 g Intramuscular 3 times per day 03/01/15 0203 03/01/15 0207   03/01/15 0500  aztreonam (AZACTAM) 2 g in dextrose 5 % 50 mL IVPB   Status:  Discontinued     2 g 100 mL/hr over 30 Minutes Intravenous 3 times per day 03/01/15 0208 03/03/15 0823   03/01/15 0300  metroNIDAZOLE (FLAGYL) IVPB 500 mg  Status:  Discontinued     500 mg 100 mL/hr over 60 Minutes Intravenous 3 times per day 03/01/15 0203 03/03/15 0853     Assessment: 49 yoM s/p lap cholecystectomy 3/24, acute on chronic cholecystitis. LFT's elevated, probable steatohepatitis, liver biopsy completed.   Day 3 Aztreonam begun 3/24, completed 2 days Flagyl  Low grade temps, worsening abd pain  Pharmacy requested to dose Azactam, require minimun 3 days therapy  Goal of Therapy:  Antiobiotic dose/schedule appropriate for treatment and renal function  Plan:   Today will be Day 3 Azactam 2gm q8hr  Follow vitals, blood cx no growth to date  Otho BellowsGreen, Tanya Crothers L PharmD Pager [phone] 47 10:15 AM

## 2015-03-03 NOTE — Progress Notes (Signed)
Patient feeling a little better but still very sore at his umbilicus.  Feels a little sleepy and loopy on the Robaxin.  He wanted to hold off on Toradol in case I need to operate on him.  I strongly recommend he use the ketorolac for probable muscular skeletal pain since pain seems focal at umbilicus only.  I noted that would not prohibit me from operating on him in emergent setting.  He felt reassured   Some help with low-dose fentanyl.   Give him more.  Does not want an icepack.  Feels a little cold  but no fevers or chills.  He has open to the idea of heating pad.  No nausea or vomiting.  Able to walk up and use bathroom.  If worsens or not improved, diagnostic laparoscopy.  Wife in room.  She agrees with plan.  Discussed with patient's nurse.  Ardeth SportsmanSteven C. Bhavin Monjaraz, M.D., F.A.C.S. Gastrointestinal and Minimally Invasive Surgery [facility] [address] [place], [place] [id number] [phone] Main / Paging

## 2015-03-03 NOTE — Progress Notes (Signed)
PROGRESS NOTE  Joseph McgregorStephen Milroy UJW:[id number] DOB: [birth year] DOA: 02/28/2015 PCP: Lesia HausenKUMMER,ANTHONY J, MD  HPI: 49 year old male with a history hyperlipidemia, diabetes mellitus, and hypertension presents with one-day history of colicky abdominal pain. 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 He was found to have acute cholecystitis s/p lap chole 3/24.  Subjective / 24 H Interval events - feeling a lot worse today, was OK last night, went to bed, ~3 am woke up with severe abdominal pain - no appetite this morning, can't "even look at the food"  Assessment/Plan: Principal Problem:   Acute cholecystitis s/p lap chole 03/01/2015 Active Problems:   Type 2 diabetes mellitus with hyperglycemia   Essential hypertension   Hyperlipidemia  Abdominal pain associated with vomiting due to acute cholecystitis - s/p lap chole 3/24 - worsening abdominal pain overnight - LFTs overall better, bili up but can lag behind  Diabetes mellitus type 2 - Hemoglobin A1c showing poor control - NovoLog sliding scale - will add low dose Lantus, suspect may need to go home on Lantus in addition to metformin/glipizide given his A1C - weight loss would be essential - not eating well today, hold glipizide  Morbid obesity  - eating very unhealthy, lots of fast foods, discussed about dietary changes/weight loss  Hypertension  - resume lisinopril when eating consistently  - Hydralazine IV prn SBP >180  Hyperlipidemia - On lipitor   Diet: Diet - low sodium heart healthy Fluids: NS 100 cc/h DVT Prophylaxis: Lovenox  Code Status: Full Code Family Communication: none bedside  Disposition Plan: inpatient  Consultants:  General surgery   Procedures:  None    Antibiotics Aztreonam 3/24 >> Flagyl 3/24 >>   Studies  Dg Shoulder Right  03/02/2015   CLINICAL DATA:  Status post fall with right shoulder pain  EXAM: RIGHT SHOULDER - 2+ VIEW  COMPARISON:  None.  FINDINGS: The bones of the  right shoulder are adequately mineralized. The glenohumeral and AC joints are unremarkable. There is no acute fracture. The observed portions of the right clavicle and upper right ribs are normal. The overlying soft tissues are unremarkable. There is subsegmental atelectasis or scarring in the right mid lung.  IMPRESSION: There is no acute bony abnormality of the right shoulder.   Electronically Signed   By: David  SwazilandJordan   On: 03/02/2015 14:21   Nm Hepatobiliary Including Gb  03/01/2015   CLINICAL DATA:  Evaluate for acute cholecystitis. Increased liver function test, gallstones and abdominal pain.  EXAM: NUCLEAR MEDICINE HEPATOBILIARY IMAGING  TECHNIQUE: Sequential images of the abdomen were obtained out to 60 minutes following intravenous administration of radiopharmaceutical.  RADIOPHARMACEUTICALS:  6.0 Millicurie Tc-7917m Choletec  COMPARISON:  None.  FINDINGS: Following the IV administration of the radiopharmaceutical there is uniform tracer uptake by the liver with clearance from blood pool. After 20 minutes there is activity in the common bile duct and small bowel. After 60 minutes there is no radiotracer activity identified within the gallbladder. 6 mg of morphine sulfate was administered intravenously and subsequent imaging was carried out for an additional 1 hour. No radiotracer activity identified within the gallbladder on delayed imaging and following the IV administration of morphine.  IMPRESSION: 1. Non visualization of the gallbladder. Findings compatible with cystic duct obstruction and cholecystitis.   Electronically Signed   By: Signa Kellaylor  Stroud M.D.   On: 03/01/2015 13:57   Objective  Filed Vitals:   03/03/15 [id number] 03/03/15 0809 03/03/15 0826 03/03/15 0923  BP: 146/84  176/99 159/94  Pulse: 80  86 88  Temp: 100 F (37.8 C) 98.7 F (37.1 C) 99.7 F (37.6 C) 99.7 F (37.6 C)  TempSrc: Oral Oral Oral Oral  Resp: Height:      Weight:      SpO2: 96%  95% 92%     Intake/Output Summary (Last 24 hours) at 03/03/15 1039 Last data filed at 03/03/15 4098  Gross per 24 hour  Intake 3267.5 ml  Output   2350 ml  Net  917.5 ml   Filed Weights   03/01/15 0210  Weight: 147.328 kg (324 lb 12.8 oz)   Exam:  General:  No apparent distress  HEENT: no scleral icterus  Cardiovascular: RRR without MRG  Respiratory: CTA biL, good air movement, no wheezing, no crackles, no rales  Abdomen: soft, very tender throughout, some guarding  MSK/Extremities: no clubbing/cyanosis, no joint swelling  Data Reviewed: Basic Metabolic Panel:  Recent Labs Lab 02/28/15 1846 03/01/15 0225 03/02/15 0420 03/03/15 0445  NA 138 138 142 136  K 4.1 3.8 4.6 3.8  CL 103 103 106 102  CO2 GLUCOSE 279* 303* 188* 212*  BUN CREATININE 1.14 1.15 1.38* 0.94  CALCIUM 9.2 8.1* 7.8* 8.0*   Liver Function Tests:  Recent Labs Lab 02/28/15 1846 03/01/15 0225 03/02/15 0420 03/03/15 0445  AST 28 21 182* 82*  ALT 47 40 230* 180*  ALKPHOS 65 56 85 99  BILITOT 1.4* 1.6* 1.2 1.8*  PROT 7.6 6.7 6.0 6.3  ALBUMIN 4.8 4.1 3.5 3.7    Recent Labs Lab 02/28/15 1846  LIPASE 45   CBC:  Recent Labs Lab 02/28/15 1846 03/02/15 0420  WBC 13.3* 5.2  NEUTROABS 11.6*  --   HGB 16.1 13.0  HCT 47.3 40.1  MCV 81.7 85.5  PLT 209 171   CBG:  Recent Labs Lab 03/02/15 1200 03/02/15 1753 03/02/15 2234 03/03/15 0752 03/03/15 0918  GLUCAP 190* 233* 236* 219* 211*   Scheduled Meds: . acetaminophen  1,000 mg Oral TID  . atorvastatin  40 mg Oral q1800  . aztreonam  2 g Intravenous 3 times per day  . bisacodyl  10 mg Rectal Daily  . enoxaparin (LOVENOX) injection  60 mg Subcutaneous Q24H  . glipiZIDE  5 mg Oral BID AC  . insulin aspart  0-20 Units Subcutaneous TID WC  . insulin aspart  0-5 Units Subcutaneous QHS  . ketorolac  30 mg Intravenous 4 times per day  . lip balm  1 application Topical BID  . metFORMIN  1,000 mg Oral BID WC  .  pantoprazole  80 mg Oral Daily   Continuous Infusions: . sodium chloride 1,000 mL (03/01/15 2045)  . sodium chloride 0.9 % 1,000 mL with potassium chloride 20 mEq infusion Stopped (03/01/15 1535)    Pamella Pert, MD [facility] Pager [phone]. If 7 PM - 7 AM, please contact night-coverage at [email or web address], password [facility] 03/03/2015, 10:39 AM  LOS: 2 days

## 2015-03-03 NOTE — Progress Notes (Signed)
[address] [place], [place] [address] Phone: [phone] FAX: [phone]    Emani Morad [id number] 21-Nov-1966  CARE TEAM:  PCP: Candise Che, MD  Outpatient Care Team: Patient Care Team: Candise Che, MD as PCP - General (Internal Medicine) Michael Boston, MD as Consulting Physician (General Surgery)  Inpatient Treatment Team: Treatment Team: Attending Provider: Caren Griffins, MD; Consulting Physician: Nolon Nations, MD; Rounding Team: Ian Bushman, MD; Technician: Rudi Heap, NT; Technician: Joaquin Music, NT  Problem List:   Principal Problem:   Acute cholecystitis s/p lap chole 03/01/2015 Active Problems:   Type 2 diabetes mellitus with hyperglycemia   Essential hypertension   Hyperlipidemia   2 Days Post-Op  Procedure(s): LAPAROSCOPIC CHOLECYSTECTOMY SINGLE SITE AND CORE LIVER BIOPSY   Assessment  Severe abd pain most likely muscle spasm but guarded  Plan:  -Sips only.  Serum abdominal exams.  Serial vital signs.  Serial labs.  More aggressive pain control.  Dilated to sedating.  Switched to fentanyl.  At nonsteroidal.  Scheduled Toradol today and then when necessary.  When necessary muscle relaxant.  Continue ice and heat.  Pain seems to be focus really at his umbilical incision was some referred pain to the right upper quadrant.  Stronger PPI dose.  Maalox to better control GERD since that has been an issue.  IVF bolus and full IVF for now.  If he does not improve or worsens, diagnostic laparoscopy.  Because he felt good yesterday and was eating with no nausea vomiting & walking well, I suspect he got a sharp spasm and pain when he twisted in bed last night.  LFTs are somewhat improved except for bilirubin slightly up.  Most likely related to the liver biopsy and surgery.  Mild hypertension but no tachycardia.  More consistent with pain.  Therefore will hold off.  He does not want  to have surgery.  I do think that there is perhaps an anxiety component with this but seems to be reassured.  Follow  Improve diabetic control.  Get back on oral hypoglycemics.  Defer to medical primary service.  Diabetes coronary or following.  Probably needs more aggressive regimen since hemoglobin A1c 11.4 two months ago.  Continue resistant aggressive sliding scale for now.  -VTE prophylaxis- SCDs, etc  -mobilize as tolerated to help recovery  I updated the patient's status to the patient & RN Jinny Sanders.  Recommendations were made.  Questions were answered.  The patient expressed understanding & appreciation.   Adin Hector, M.D., F.A.C.S. Gastrointestinal and Minimally Invasive Surgery [facility] [address] [place], [place] [address] [phone] Main / Paging   03/03/2015  Subjective:  Had a good day yesterday.  Woke up and through the morning with sharp periumbilical pain radiating to his side.  Somewhat helped with dilated.  Recurrent sharp crampy pain.  Some nausea with it.  No emesis.  Concerned.  Refuses to take pain meds until reevaluated this morning.  Still with moderate constant right shoulder pain.  Not worse.  Nor longer having left-sided chest wall soreness.  No subjective fevers or chills.  Nurse in room.  Objective:  Vital signs:  Filed Vitals:   03/02/15 2145 03/03/15 0625 03/03/15 0809 03/03/15 0826  BP: 144/90 146/84  176/99  Pulse: 77 80  86  Temp: 98 F (36.7 C) 100 F (37.8 C) 98.7 F (37.1 C) 99.7 F (37.6 C)  TempSrc: Oral Oral  Oral Oral  Resp: $Remo'18 18  18  'lBvrg$ Height:      Weight:      SpO2: 96% 96%  95%    Last BM Date: 03/02/15  Intake/Output   Yesterday:  03/25 0701 - 03/26 0700 In: 3767.5 [P.O.:1320; I.V.:2097.5; IV Piggyback:350] Out: 2350 [Urine:2350] This shift:     Bowel function:  Flatus: y  BM: y  Drain: n/a  Physical Exam:  General: Pt awake/alert/oriented x4 in mod  acute distress at first, then mild Eyes: PERRL, normal EOM.  Sclera clear.  No icterus Neuro: CN II-XII intact w/o focal sensory/motor deficits. Lymph: No head/neck/groin lymphadenopathy Psych:  No delerium/psychosis/paranoia HENT: Normocephalic, Mucus membranes moist.  No thrush Neck: Supple, No tracheal deviation Chest: No chest wall pain w good excursion CV:  Pulses intact.  Regular rhythm MS: Normal AROM mjr joints.  No obvious deformity Abdomen: Soft.  Obese   Nondistended.  Mod tender at umbilical incision & RUQ only.  Some bruising around bellybutton.  No purulent drainage.  No bile.  No incarcerated hernias. Ext:  SCDs BLE.  No mjr edema.  No cyanosis Skin: No petechiae / purpura  Results:   Labs: Results for orders placed or performed during the hospital encounter of 02/28/15 (from the past 48 hour(s))  Glucose, capillary     Status: Abnormal   Collection Time: 03/01/15  2:09 PM  Result Value Ref Range   Glucose-Capillary 206 (H) 70 - 99 mg/dL   Comment 1 Notify RN   Surgical pcr screen     Status: Abnormal   Collection Time: 03/01/15  2:44 PM  Result Value Ref Range   MRSA, PCR NEGATIVE NEGATIVE   Staphylococcus aureus POSITIVE (A) NEGATIVE    Comment:        The Xpert SA Assay (FDA approved for NASAL specimens in patients over 47 years of age), is one component of a comprehensive surveillance program.  Test performance has been validated by [facility] for patients greater than or equal to 47 year old. It is not intended to diagnose infection nor to guide or monitor treatment.   Glucose, capillary     Status: Abnormal   Collection Time: 03/01/15  6:34 PM  Result Value Ref Range   Glucose-Capillary 322 (H) 70 - 99 mg/dL  Glucose, capillary     Status: Abnormal   Collection Time: 03/01/15 10:07 PM  Result Value Ref Range   Glucose-Capillary 292 (H) 70 - 99 mg/dL  Comprehensive metabolic panel     Status: Abnormal   Collection Time: 03/02/15  4:20 AM  Result  Value Ref Range   Sodium 142 135 - 145 mmol/L   Potassium 4.6 3.5 - 5.1 mmol/L    Comment: DELTA CHECK NOTED REPEATED TO VERIFY    Chloride 106 96 - 112 mmol/L   CO2 29 19 - 32 mmol/L   Glucose, Bld 188 (H) 70 - 99 mg/dL   BUN 19 6 - 23 mg/dL   Creatinine, Ser 1.38 (H) 0.50 - 1.35 mg/dL   Calcium 7.8 (L) 8.4 - 10.5 mg/dL   Total Protein 6.0 6.0 - 8.3 g/dL   Albumin 3.5 3.5 - 5.2 g/dL   AST 182 (H) 0 - 37 U/L   ALT 230 (H) 0 - 53 U/L   Alkaline Phosphatase 85 39 - 117 U/L   Total Bilirubin 1.2 0.3 - 1.2 mg/dL   GFR calc non Af Amer 60 (L) >90 mL/min   GFR calc Af Amer 69 (L) >90  mL/min    Comment: (NOTE) The eGFR has been calculated using the CKD EPI equation. This calculation has not been validated in all clinical situations. eGFR's persistently <90 mL/min signify possible Chronic Kidney Disease.    Anion gap 7 5 - 15  CBC     Status: None   Collection Time: 03/02/15  4:20 AM  Result Value Ref Range   WBC 5.2 4.0 - 10.5 K/uL   RBC 4.69 4.22 - 5.81 MIL/uL   Hemoglobin 13.0 13.0 - 17.0 g/dL    Comment: DELTA CHECK NOTED REPEATED TO VERIFY    HCT 40.1 39.0 - 52.0 %   MCV 85.5 78.0 - 100.0 fL   MCH 27.7 26.0 - 34.0 pg   MCHC 32.4 30.0 - 36.0 g/dL   RDW 14.4 11.5 - 15.5 %   Platelets 171 150 - 400 K/uL  Hemoglobin A1c     Status: Abnormal   Collection Time: 03/02/15  4:20 AM  Result Value Ref Range   Hgb A1c MFr Bld 11.0 (H) 4.8 - 5.6 %    Comment: (NOTE)         Pre-diabetes: 5.7 - 6.4         Diabetes: >6.4         Glycemic control for adults with diabetes: <7.0    Mean Plasma Glucose 269 mg/dL    Comment: (NOTE) Performed At: [facility] [address] [place], [place] [id number] Lindon Romp MD JT:[id number]   Glucose, capillary     Status: Abnormal   Collection Time: 03/02/15  7:37 AM  Result Value Ref Range   Glucose-Capillary 185 (H) 70 - 99 mg/dL  Glucose, capillary     Status: Abnormal   Collection Time: 03/02/15 12:00 PM  Result Value  Ref Range   Glucose-Capillary 190 (H) 70 - 99 mg/dL  Glucose, capillary     Status: Abnormal   Collection Time: 03/02/15  5:53 PM  Result Value Ref Range   Glucose-Capillary 233 (H) 70 - 99 mg/dL  Glucose, capillary     Status: Abnormal   Collection Time: 03/02/15 10:34 PM  Result Value Ref Range   Glucose-Capillary 236 (H) 70 - 99 mg/dL  Comprehensive metabolic panel     Status: Abnormal   Collection Time: 03/03/15  4:45 AM  Result Value Ref Range   Sodium 136 135 - 145 mmol/L   Potassium 3.8 3.5 - 5.1 mmol/L    Comment: DELTA CHECK NOTED   Chloride 102 96 - 112 mmol/L   CO2 25 19 - 32 mmol/L   Glucose, Bld 212 (H) 70 - 99 mg/dL   BUN 13 6 - 23 mg/dL   Creatinine, Ser 0.94 0.50 - 1.35 mg/dL   Calcium 8.0 (L) 8.4 - 10.5 mg/dL   Total Protein 6.3 6.0 - 8.3 g/dL   Albumin 3.7 3.5 - 5.2 g/dL   AST 82 (H) 0 - 37 U/L   ALT 180 (H) 0 - 53 U/L   Alkaline Phosphatase 99 39 - 117 U/L   Total Bilirubin 1.8 (H) 0.3 - 1.2 mg/dL   GFR calc non Af Amer >90 >90 mL/min   GFR calc Af Amer >90 >90 mL/min    Comment: (NOTE) The eGFR has been calculated using the CKD EPI equation. This calculation has not been validated in all clinical situations. eGFR's persistently <90 mL/min signify possible Chronic Kidney Disease.    Anion gap 9 5 - 15  Glucose, capillary     Status: Abnormal  Collection Time: 03/03/15  7:52 AM  Result Value Ref Range   Glucose-Capillary 219 (H) 70 - 99 mg/dL    Imaging / Studies: Dg Shoulder Right  03/02/2015   CLINICAL DATA:  Status post fall with right shoulder pain  EXAM: RIGHT SHOULDER - 2+ VIEW  COMPARISON:  None.  FINDINGS: The bones of the right shoulder are adequately mineralized. The glenohumeral and AC joints are unremarkable. There is no acute fracture. The observed portions of the right clavicle and upper right ribs are normal. The overlying soft tissues are unremarkable. There is subsegmental atelectasis or scarring in the right mid lung.  IMPRESSION:  There is no acute bony abnormality of the right shoulder.   Electronically Signed   By: David  Martin[place]   On: 03/02/2015 14:21   Nm Hepatobiliary Including Gb  03/01/2015   CLINICAL DATA:  Evaluate for acute cholecystitis. Increased liver function test, gallstones and abdominal pain.  EXAM: NUCLEAR MEDICINE HEPATOBILIARY IMAGING  TECHNIQUE: Sequential images of the abdomen were obtained out to 60 minutes following intravenous administration of radiopharmaceutical.  RADIOPHARMACEUTICALS:  6.0 Millicurie [id number] Choletec  COMPARISON:  None.  FINDINGS: Following the IV administration of the radiopharmaceutical there is uniform tracer uptake by the liver with clearance from blood pool. After 20 minutes there is activity in the common bile duct and small bowel. After 60 minutes there is no radiotracer activity identified within the gallbladder. 6 mg of morphine sulfate was administered intravenously and subsequent imaging was carried out for an additional 1 hour. No radiotracer activity identified within the gallbladder on delayed imaging and following the IV administration of morphine.  IMPRESSION: 1. Non visualization of the gallbladder. Findings compatible with cystic duct obstruction and cholecystitis.   Electronically Signed   By: Kerby Moors M.D.   On: 03/01/2015 13:57    Medications / Allergies: per chart  Antibiotics: Anti-infectives    Start     Dose/Rate Route Frequency Ordered Stop   03/01/15 0600  aztreonam (AZACTAM) injection 2 g  Status:  Discontinued     2 g Intramuscular 3 times per day 03/01/15 0203 03/01/15 0207   03/01/15 0500  aztreonam (AZACTAM) 2 g in dextrose 5 % 50 mL IVPB  Status:  Discontinued     2 g 100 mL/hr over 30 Minutes Intravenous 3 times per day 03/01/15 0208 03/03/15 0823   03/01/15 0300  metroNIDAZOLE (FLAGYL) IVPB 500 mg  Status:  Discontinued     500 mg 100 mL/hr over 60 Minutes Intravenous 3 times per day 03/01/15 0203 03/03/15 0853       Note: Portions of  this report may have been transcribed using voice recognition software. Every effort was made to ensure accuracy; however, inadvertent computerized transcription errors may be present.   Any transcriptional errors that result from this process are unintentional.      Adin Hector, M.D., F.A.C.S. Gastrointestinal and Minimally Invasive Surgery [facility] [address], [place], [place] [address] [phone] Main / Paging

## 2015-03-03 NOTE — Progress Notes (Signed)
Offered Dilaudid IV to pt for pain.  Pt stated he wanted to wait until Dr. Michaell CowingGross made his rounds first.

## 2015-03-04 DIAGNOSIS — R109 Unspecified abdominal pain: Secondary | ICD-10-CM

## 2015-03-04 LAB — CBC
HEMATOCRIT: 35 % — AB (ref 39.0–52.0)
Hemoglobin: 11.6 g/dL — ABNORMAL LOW (ref 13.0–17.0)
MCH: 27.7 pg (ref 26.0–34.0)
MCHC: 33.1 g/dL (ref 30.0–36.0)
MCV: 83.5 fL (ref 78.0–100.0)
PLATELETS: 169 10*3/uL (ref 150–400)
RBC: 4.19 MIL/uL — AB (ref 4.22–5.81)
RDW: 13.8 % (ref 11.5–15.5)
WBC: 4.3 10*3/uL (ref 4.0–10.5)

## 2015-03-04 LAB — COMPREHENSIVE METABOLIC PANEL
ALBUMIN: 3.4 g/dL — AB (ref 3.5–5.2)
ALK PHOS: 91 U/L (ref 39–117)
ALT: 124 U/L — ABNORMAL HIGH (ref 0–53)
AST: 34 U/L (ref 0–37)
Anion gap: 6 (ref 5–15)
BUN: 13 mg/dL (ref 6–23)
CO2: 27 mmol/L (ref 19–32)
Calcium: 8 mg/dL — ABNORMAL LOW (ref 8.4–10.5)
Chloride: 104 mmol/L (ref 96–112)
Creatinine, Ser: 0.96 mg/dL (ref 0.50–1.35)
GFR calc Af Amer: >90 mL/min (ref >90)
GLUCOSE: 175 mg/dL — AB (ref 70–99)
Potassium: 3.5 mmol/L (ref 3.5–5.1)
Sodium: 137 mmol/L (ref 135–145)
Total Bilirubin: 1 mg/dL (ref 0.3–1.2)
Total Protein: 6.2 g/dL (ref 6.0–8.3)

## 2015-03-04 LAB — GLUCOSE, CAPILLARY
GLUCOSE-CAPILLARY: 150 mg/dL — AB (ref 70–99)
GLUCOSE-CAPILLARY: 161 mg/dL — AB (ref 70–99)
GLUCOSE-CAPILLARY: 172 mg/dL — AB (ref 70–99)
Glucose-Capillary: 163 mg/dL — ABNORMAL HIGH (ref 70–99)
Glucose-Capillary: 175 mg/dL — ABNORMAL HIGH (ref 70–99)

## 2015-03-04 MED ORDER — PANTOPRAZOLE SODIUM 40 MG PO TBEC
80.0000 mg | DELAYED_RELEASE_TABLET | Freq: Two times a day (BID) | ORAL | Status: DC
  Administered 2015-03-04 – 2015-03-07 (×7): 80 mg via ORAL
  Filled 2015-03-04 (×8): qty 2

## 2015-03-04 NOTE — Progress Notes (Signed)
General Surgery Note  LOS: 3 days  POD -  3 Days Post-Op  Assessment/Plan: 1.  LAPAROSCOPIC CHOLECYSTECTOMY SINGLE SITE AND CORE LIVER BIOPSY  - 03/01/2015 - D.Willine Schwalbe  The patient fell off the OR tableq  LFT's improving and WBC normal.  He does not think that he is ready to go home.  2.  DVT prophylaxis - Lovenox 3.  HTN 4.  DM  Glucose - 161 - 03/04/2015   Principal Problem:   Acute cholecystitis s/p lap chole 03/01/2015 Active Problems:   Type 2 diabetes mellitus with hyperglycemia   Essential hypertension   Hyperlipidemia   Subjective:  He looks better, but still complains of abdominal pain around umbilical incision.   He does not think that he is ready to go home  He is in room by himself. Objective:   Filed Vitals:   03/04/15 0616  BP: 156/87  Pulse: 88  Temp: 98.5 F (36.9 C)  Resp: 18     Intake/Output from previous day:  03/26 0701 - 03/27 0700 In: 3098.3 [P.O.:480; I.V.:1418.3; IV Piggyback:1200] Out: 2150 [Urine:2150]  Intake/Output this shift:      Physical Exam:   General: Large WM who is alert and oriented.    HEENT: Normal. Pupils equal. .   Lungs: Clear.     Abdomen: Soft.  Has BS.    Wound: Clean.  Has bruising around his umbilicus, but the redness looks better.   Lab Results:     Recent Labs  03/02/15 0420 03/04/15 0508  WBC 5.2 4.3  HGB 13.0 11.6*  HCT 40.1 35.0*  PLT 171 169    BMET    Recent Labs  03/03/15 0445 03/04/15 0508  NA 136 137  K 3.8 3.5  CL 102 104  CO2 25 27  GLUCOSE 212* 175*  BUN 13 13  CREATININE 0.94 0.96  CALCIUM 8.0* 8.0*    PT/INR  No results for input(s): LABPROT, INR in the last 72 hours.  ABG  No results for input(s): PHART, HCO3 in the last 72 hours.  Invalid input(s): PCO2, PO2   Studies/Results:  Dg Shoulder Right  03/02/2015   CLINICAL DATA:  Status post fall with right shoulder pain  EXAM: RIGHT SHOULDER - 2+ VIEW  COMPARISON:  None.  FINDINGS: The bones of the right shoulder are  adequately mineralized. The glenohumeral and AC joints are unremarkable. There is no acute fracture. The observed portions of the right clavicle and upper right ribs are normal. The overlying soft tissues are unremarkable. There is subsegmental atelectasis or scarring in the right mid lung.  IMPRESSION: There is no acute bony abnormality of the right shoulder.   Electronically Signed   By: Charley Miske  [place]   On: 03/02/2015 14:21     Anti-infectives:   Anti-infectives    Start     Dose/Rate Route Frequency Ordered Stop   03/03/15 1400  aztreonam (AZACTAM) 2 g in dextrose 5 % 50 mL IVPB     2 g 100 mL/hr over 30 Minutes Intravenous 3 times per day 03/03/15 1006     03/01/15 0600  aztreonam (AZACTAM) injection 2 g  Status:  Discontinued     2 g Intramuscular 3 times per day 03/01/15 0203 03/01/15 0207   03/01/15 0500  aztreonam (AZACTAM) 2 g in dextrose 5 % 50 mL IVPB  Status:  Discontinued     2 g 100 mL/hr over 30 Minutes Intravenous 3 times per day 03/01/15 0208 03/03/15 0823   03/01/15  0300  metroNIDAZOLE (FLAGYL) IVPB 500 mg  Status:  Discontinued     500 mg 100 mL/hr over 60 Minutes Intravenous 3 times per day 03/01/15 0203 03/03/15 0853      Ovidio Kin, MD, FACS Pager: [phone] [facility] Office: [phone] 03/04/2015

## 2015-03-04 NOTE — Progress Notes (Signed)
PROGRESS NOTE  Joseph [name] ZOX:[id number] DOB: 09-03-1966 DOA: 02/28/2015 PCP: Lesia Hausen, MD  HPI: 49 year old male with a history hyperlipidemia, diabetes mellitus, and hypertension presents with one-day history of colicky abdominal pain. The patient works as a Radiation protection practitioner for [employer]. He was found to have acute cholecystitis s/p lap chole 3/24.  Subjective / 24 H Interval events - abdominal pain better today   Assessment/Plan: Principal Problem:   Acute cholecystitis s/p lap chole 03/01/2015 Active Problems:   Type 2 diabetes mellitus with hyperglycemia   Essential hypertension   Hyperlipidemia  Abdominal pain associated with vomiting due to acute cholecystitis - s/p lap chole 3/24 - worsening abdominal pain overnight 3/25 - 3/26, improving snice - LFTs better  Diabetes mellitus type 2 - Hemoglobin A1c showing poor control - NovoLog sliding scale - low dose Lantus - discussed with patient about home insulin, he is quite resistant and chooses not to have insulin at home because he works as a Radiation protection practitioner and will be difficult for him to continue his job. His endocrinologist is entertaining the idea of Invokana, will give him a prescription when he leaves the hospital.   Morbid obesity  - eating very unhealthy, lots of fast foods, discussed about dietary changes/weight loss - ongoing discussions about weight loss   Hypertension  - resume lisinopril when eating consistently  - Hydralazine IV prn SBP >180  Hyperlipidemia - On lipitor   Diet: Diet - low sodium heart healthy Diet clear liquid Room service appropriate?: Yes; Fluid consistency:: Thin; Fluid restriction:: 2000 mL Fluid Fluids: NS 100 cc/h DVT Prophylaxis: Lovenox  Code Status: Full Code Family Communication: none bedside  Disposition Plan: inpatient  Consultants:  General surgery   Procedures:  None    Antibiotics Aztreonam 3/24 >> Flagyl 3/24 >>   Studies  Dg Shoulder  Right  03/02/2015   CLINICAL DATA:  Status post fall with right shoulder pain  EXAM: RIGHT SHOULDER - 2+ VIEW  COMPARISON:  None.  FINDINGS: The bones of the right shoulder are adequately mineralized. The glenohumeral and AC joints are unremarkable. There is no acute fracture. The observed portions of the right clavicle and upper right ribs are normal. The overlying soft tissues are unremarkable. There is subsegmental atelectasis or scarring in the right mid lung.  IMPRESSION: There is no acute bony abnormality of the right shoulder.   Electronically Signed   By: David  [place]   On: 03/02/2015 14:21   Objective  Filed Vitals:   03/03/15 8119 03/03/15 1857 03/03/15 2135 03/04/15 0616  BP: 159/94 164/92 169/97 156/87  Pulse: 88 81 88 88  Temp: 99.7 F (37.6 C) 98.7 F (37.1 C) 98.8 F (37.1 C) 98.5 F (36.9 C)  TempSrc: Oral Oral Oral Oral  Resp: Height:      Weight:      SpO2: 92% 97% 98% 98%    Intake/Output Summary (Last 24 hours) at 03/04/15 0930 Last data filed at 03/04/15 0600  Gross per 24 hour  Intake 2098.33 ml  Output   2150 ml  Net -51.67 ml   Filed Weights   03/01/15 0210  Weight: 147.328 kg (324 lb 12.8 oz)   Exam:  General:  No apparent distress  HEENT: no scleral icterus  Cardiovascular: RRR without MRG  Respiratory: CTA biL, good air movement, no wheezing, no crackles, no rales  Abdomen: soft, less tender today   MSK/Extremities: no clubbing/cyanosis, no joint swelling  Data Reviewed: Basic Metabolic Panel:  Recent Labs Lab 02/28/15 1846 03/01/15 0225 03/02/15 0420 03/03/15 0445 03/04/15 0508  NA 138 138 142 136 137  K 4.1 3.8 4.6 3.8 3.5  CL 103 103 106 102 104  CO2 23 24 29 25 27   GLUCOSE 279* 303* 188* 212* 175*  BUN 18 21 19 13 13   CREATININE 1.14 1.15 1.38* 0.94 0.96  CALCIUM 9.2 8.1* 7.8* 8.0* 8.0*   Liver Function Tests:  Recent Labs Lab 02/28/15 1846 03/01/15 0225 03/02/15 0420 03/03/15 0445 03/04/15 0508   AST 28 21 182* 82* 34  ALT 47 40 230* 180* 124*  ALKPHOS 65 56 85 99 91  BILITOT 1.4* 1.6* 1.2 1.8* 1.0  PROT 7.6 6.7 6.0 6.3 6.2  ALBUMIN 4.8 4.1 3.5 3.7 3.4*    Recent Labs Lab 02/28/15 1846  LIPASE 45   CBC:  Recent Labs Lab 02/28/15 1846 03/02/15 0420 03/04/15 0508  WBC 13.3* 5.2 4.3  NEUTROABS 11.6*  --   --   HGB 16.1 13.0 11.6*  HCT 47.3 40.1 35.0*  MCV 81.7 85.5 83.5  PLT 209 171 169   CBG:  Recent Labs Lab 03/03/15 0918 03/03/15 1342 03/03/15 1711 03/03/15 2111 03/04/15 0815  GLUCAP 211* 194* 171* 150* 161*   Scheduled Meds: . acetaminophen  1,000 mg Oral TID  . atorvastatin  40 mg Oral q1800  . aztreonam  2 g Intravenous 3 times per day  . bisacodyl  10 mg Rectal Daily  . enoxaparin (LOVENOX) injection  60 mg Subcutaneous Q24H  . insulin aspart  0-20 Units Subcutaneous TID WC  . insulin aspart  0-5 Units Subcutaneous QHS  . insulin glargine  5 Units Subcutaneous QHS  . lip balm  1 application Topical BID  . metFORMIN  1,000 mg Oral BID WC  . pantoprazole  80 mg Oral BID   Continuous Infusions: . sodium chloride 50 mL/hr at 03/04/15 0504  . sodium chloride 0.9 % 1,000 mL with potassium chloride 20 mEq infusion Stopped (03/01/15 1535)   Time spent: 25 minutes  Pamella Pertostin Gherghe, MD [facility] Pager [phone]. If 7 PM - 7 AM, please contact night-coverage at [email or web address], password [email or web address] 03/04/2015, 9:30 AM  LOS: 3 days

## 2015-03-05 LAB — CBC
HCT: 34.8 % — ABNORMAL LOW (ref 39.0–52.0)
Hemoglobin: 11.4 g/dL — ABNORMAL LOW (ref 13.0–17.0)
MCH: 27.1 pg (ref 26.0–34.0)
MCHC: 32.8 g/dL (ref 30.0–36.0)
MCV: 82.9 fL (ref 78.0–100.0)
Platelets: 185 10*3/uL (ref 150–400)
RBC: 4.2 MIL/uL — AB (ref 4.22–5.81)
RDW: 13.6 % (ref 11.5–15.5)
WBC: 4.9 10*3/uL (ref 4.0–10.5)

## 2015-03-05 LAB — GLUCOSE, CAPILLARY
Glucose-Capillary: 163 mg/dL — ABNORMAL HIGH (ref 70–99)
Glucose-Capillary: 197 mg/dL — ABNORMAL HIGH (ref 70–99)
Glucose-Capillary: 223 mg/dL — ABNORMAL HIGH (ref 70–99)
Glucose-Capillary: 249 mg/dL — ABNORMAL HIGH (ref 70–99)

## 2015-03-05 LAB — COMPREHENSIVE METABOLIC PANEL
ALT: 109 U/L — ABNORMAL HIGH (ref 0–53)
ANION GAP: 7 (ref 5–15)
AST: 48 U/L — AB (ref 0–37)
Albumin: 3.3 g/dL — ABNORMAL LOW (ref 3.5–5.2)
Alkaline Phosphatase: 89 U/L (ref 39–117)
BILIRUBIN TOTAL: 0.8 mg/dL (ref 0.3–1.2)
BUN: 13 mg/dL (ref 6–23)
CALCIUM: 8.3 mg/dL — AB (ref 8.4–10.5)
CHLORIDE: 103 mmol/L (ref 96–112)
CO2: 28 mmol/L (ref 19–32)
Creatinine, Ser: 1.05 mg/dL (ref 0.50–1.35)
GFR calc non Af Amer: 83 mL/min — ABNORMAL LOW (ref >90)
Glucose, Bld: 160 mg/dL — ABNORMAL HIGH (ref 70–99)
Potassium: 3.7 mmol/L (ref 3.5–5.1)
Sodium: 138 mmol/L (ref 135–145)
Total Protein: 5.9 g/dL — ABNORMAL LOW (ref 6.0–8.3)

## 2015-03-05 MED ORDER — SODIUM CHLORIDE 0.9 % IJ SOLN: 3.0000 mL | INTRAMUSCULAR | Status: DC | PRN

## 2015-03-05 MED ORDER — SODIUM CHLORIDE 0.9 % IV SOLN: 250.0000 mL | INTRAVENOUS | Status: DC | PRN

## 2015-03-05 MED ORDER — LISINOPRIL 10 MG PO TABS
10.0000 mg | ORAL_TABLET | Freq: Every day | ORAL | Status: DC
  Administered 2015-03-05 – 2015-03-07 (×3): 10 mg via ORAL
  Filled 2015-03-05 (×4): qty 1

## 2015-03-05 MED ORDER — SODIUM CHLORIDE 0.9 % IJ SOLN
3.0000 mL | Freq: Two times a day (BID) | INTRAMUSCULAR | Status: DC
  Administered 2015-03-06: 3 mL via INTRAVENOUS

## 2015-03-05 NOTE — Progress Notes (Addendum)
PROGRESS NOTE  Joseph McgregorStephen Appleton WUJ:[id number] DOB: 1966-10-09 DOA: 02/28/2015 PCP: Lesia HausenKUMMER,ANTHONY J, MD  HPI: 49 year old male with a history hyperlipidemia, diabetes mellitus, and hypertension presents with one-day history of colicky abdominal pain. 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 He was found to have acute cholecystitis s/p lap chole 3/24.  Subjective / 24 H Interval events - abdominal pain better today but he still feels quite tender and has a hard time turing in bed during the nighttime  - appetite better  Assessment/Plan: Principal Problem:   Acute cholecystitis s/p lap chole 03/01/2015 Active Problems:   Type 2 diabetes mellitus with hyperglycemia   Essential hypertension   Hyperlipidemia  Abdominal pain associated with vomiting due to acute cholecystitis - s/p lap chole 3/24 - worsening abdominal pain overnight 3/25 - 3/26, improving since - LFTs better  Diabetes mellitus type 2 - Hemoglobin A1c showing poor control - NovoLog sliding scale - low dose Lantus - discussed with patient about home insulin, he is quite resistant and chooses not to have insulin at home 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  - His endocrinologist is entertaining the idea of Invokana, will give him a prescription when he leaves the hospital.   Morbid obesity  - ongoing discussions about weight loss   Hypertension  - resume lisinopril today  - Hydralazine IV prn SBP >180  Hyperlipidemia - On lipitor   Diet: Diet - low sodium heart healthy DIET SOFT Room service appropriate?: Yes; Fluid consistency:: Thin Fluids: none DVT Prophylaxis: Lovenox  Code Status: Full Code Family Communication: none bedside  Disposition Plan: inpatient, home when ready   Consultants:  General surgery   Procedures:  None    Antibiotics Aztreonam 3/24 >> Flagyl 3/24 >>   Studies  No results found. Objective  Filed Vitals:   03/04/15 0616 03/04/15 1417 03/04/15 2117 03/05/15 0550  BP: 156/87 170/98 177/88 154/88  Pulse: 88 70 73 59  Temp: 98.5 F (36.9 C) 98.2 F (36.8 C) 98.2 F (36.8 C) 97.4 F (36.3 C)  TempSrc: Oral Oral Oral Oral  Resp: 18 18 18 18   Height:      Weight:      SpO2: 98% 97% 97% 98%    Intake/Output Summary (Last 24 hours) at 03/05/15 1128 Last data filed at 03/05/15 0200  Gross per 24 hour  Intake   1700 ml  Output   1700 ml  Net      0 ml   Filed Weights   03/01/15 0210  Weight: 147.328 kg (324 lb 12.8 oz)   Exam:  General:  No apparent distress  HEENT: no scleral icterus  Cardiovascular: RRR without MRG  Respiratory: CTA biL, good air movement, no wheezing, no crackles, no rales  Abdomen: soft, less tender today, moderate bruising peri umbilical  MSK/Extremities: no clubbing/cyanosis, no joint swelling  Data Reviewed: Basic Metabolic Panel:  Recent Labs Lab 03/01/15 0225 03/02/15 0420 03/03/15 0445 03/04/15 0508 03/05/15 0430  NA 138 142 136 137 138  K 3.8 4.6 3.8 3.5 3.7  CL 103 106 102 104 103  CO2 24 29 25 27 28   GLUCOSE 303* 188* 212* 175* 160*  BUN 21 19 13 13 13   CREATININE 1.15 1.38* 0.94 0.96 1.05  CALCIUM 8.1* 7.8* 8.0* 8.0* 8.3*   Liver Function Tests:  Recent Labs Lab 03/01/15 0225 03/02/15 0420 03/03/15 0445 03/04/15 0508 03/05/15 0430  AST 21 182* 82* 34  48*  ALT 40 230* 180* 124* 109*  ALKPHOS 56 85 99 91 89  BILITOT 1.6* 1.2 1.8* 1.0 0.8  PROT 6.7 6.0 6.3 6.2 5.9*  ALBUMIN 4.1 3.5 3.7 3.4* 3.3*    Recent Labs Lab 02/28/15 1846  LIPASE 45   CBC:  Recent Labs Lab 02/28/15 1846 03/02/15 0420 03/04/15 0508 03/05/15 0430  WBC 13.3* 5.2 4.3 4.9  NEUTROABS 11.6*  --   --   --   HGB 16.1 13.0 11.6* 11.4*  HCT 47.3 40.1 35.0* 34.8*  MCV 81.7 85.5 83.5 82.9  PLT 209 171 169 185   CBG:  Recent Labs Lab 03/04/15 0815 03/04/15 1427 03/04/15 1712 03/04/15 2151 03/05/15 0739  GLUCAP 161* 163* 172* 175* 163*    Scheduled Meds: . acetaminophen  1,000 mg Oral TID  . atorvastatin  40 mg Oral q1800  . aztreonam  2 g Intravenous 3 times per day  . bisacodyl  10 mg Rectal Daily  . enoxaparin (LOVENOX) injection  60 mg Subcutaneous Q24H  . insulin aspart  0-20 Units Subcutaneous TID WC  . insulin aspart  0-5 Units Subcutaneous QHS  . insulin glargine  5 Units Subcutaneous QHS  . lip balm  1 application Topical BID  . metFORMIN  1,000 mg Oral BID WC  . pantoprazole  80 mg Oral BID  . sodium chloride  3 mL Intravenous Q12H   Continuous Infusions: . sodium chloride 0.9 % 1,000 mL with potassium chloride 20 mEq infusion Stopped (03/01/15 1535)   Time spent: 15 minutes  Pamella Pert, MD [facility] Pager [phone]. If 7 PM - 7 AM, please contact night-coverage at [email or web address], password [facility] 03/05/2015, 11:28 AM  LOS: 4 days

## 2015-03-05 NOTE — Progress Notes (Signed)
Patient ID: Joseph [name], male   DOB: Jun 09, 1966, 49 y.o.   MRN: [id number]     [facility]      [address] [place], [place] [address]    Phone: [phone] FAX: [phone]     Subjective: Intermittent nausea.  No vomiting.  Had a BM last night.  Solids this AM.  Walking.  Continues to have RUQ pain.  VSS.  Afebrile.    Objective:  Vital signs:  Filed Vitals:   03/04/15 0616 03/04/15 1417 03/04/15 2117 03/05/15 0550  BP: 156/87 170/98 177/88 154/88  Pulse: 88 70 73 59  Temp: 98.5 F (36.9 C) 98.2 F (36.8 C) 98.2 F (36.8 C) 97.4 F (36.3 C)  TempSrc: Oral Oral Oral Oral  Resp: $Remo'18 18 18 18  'lObZl$ Height:      Weight:      SpO2: 98% 97% 97% 98%    Last BM Date: 03/04/15  Intake/Output   Yesterday:  03/27 0701 - 03/28 0700 In: 1960 [P.O.:960; I.V.:1000] Out: 2050 [Urine:2050] This shift:     Physical Exam: General: Pt awake/alert/oriented x4 in no acute distress  Abdomen: Soft.  Nondistended.  Mild TTP at incision site and upper abdomen.  Umbilical incision--mild erythema, no abscess.   No evidence of peritonitis.  No incarcerated hernias.   Problem List:   Principal Problem:   Acute cholecystitis s/p lap chole 03/01/2015 Active Problems:   Type 2 diabetes mellitus with hyperglycemia   Essential hypertension   Hyperlipidemia    Results:   Labs: Results for orders placed or performed during the hospital encounter of 02/28/15 (from the past 48 hour(s))  Glucose, capillary     Status: Abnormal   Collection Time: 03/03/15  1:42 PM  Result Value Ref Range   Glucose-Capillary 194 (H) 70 - 99 mg/dL  Glucose, capillary     Status: Abnormal   Collection Time: 03/03/15  5:11 PM  Result Value Ref Range   Glucose-Capillary 171 (H) 70 - 99 mg/dL  Glucose, capillary     Status: Abnormal   Collection Time: 03/03/15  9:11 PM  Result Value Ref Range   Glucose-Capillary 150 (H) 70 - 99 mg/dL   Comment 1 Notify RN    CBC     Status: Abnormal   Collection Time: 03/04/15  5:08 AM  Result Value Ref Range   WBC 4.3 4.0 - 10.5 K/uL   RBC 4.19 (L) 4.22 - 5.81 MIL/uL   Hemoglobin 11.6 (L) 13.0 - 17.0 g/dL   HCT 35.0 (L) 39.0 - 52.0 %   MCV 83.5 78.0 - 100.0 fL   MCH 27.7 26.0 - 34.0 pg   MCHC 33.1 30.0 - 36.0 g/dL   RDW 13.8 11.5 - 15.5 %   Platelets 169 150 - 400 K/uL  Comprehensive metabolic panel     Status: Abnormal   Collection Time: 03/04/15  5:08 AM  Result Value Ref Range   Sodium 137 135 - 145 mmol/L   Potassium 3.5 3.5 - 5.1 mmol/L   Chloride 104 96 - 112 mmol/L   CO2 27 19 - 32 mmol/L   Glucose, Bld 175 (H) 70 - 99 mg/dL   BUN 13 6 - 23 mg/dL   Creatinine, Ser 0.96 0.50 - 1.35 mg/dL   Calcium 8.0 (L) 8.4 - 10.5 mg/dL   Total Protein 6.2 6.0 - 8.3 g/dL   Albumin 3.4 (L) 3.5 - 5.2 g/dL   AST 34 0 - 37 U/L  ALT 124 (H) 0 - 53 U/L   Alkaline Phosphatase 91 39 - 117 U/L   Total Bilirubin 1.0 0.3 - 1.2 mg/dL   GFR calc non Af Amer >90 >90 mL/min   GFR calc Af Amer >90 >90 mL/min    Comment: (NOTE) The eGFR has been calculated using the CKD EPI equation. This calculation has not been validated in all clinical situations. eGFR's persistently <90 mL/min signify possible Chronic Kidney Disease.    Anion gap 6 5 - 15  Glucose, capillary     Status: Abnormal   Collection Time: 03/04/15  8:15 AM  Result Value Ref Range   Glucose-Capillary 161 (H) 70 - 99 mg/dL  Glucose, capillary     Status: Abnormal   Collection Time: 03/04/15  2:27 PM  Result Value Ref Range   Glucose-Capillary 163 (H) 70 - 99 mg/dL  Glucose, capillary     Status: Abnormal   Collection Time: 03/04/15  5:12 PM  Result Value Ref Range   Glucose-Capillary 172 (H) 70 - 99 mg/dL   Comment 1 Document in Chart    Comment 2 Repeat Test   Glucose, capillary     Status: Abnormal   Collection Time: 03/04/15  9:51 PM  Result Value Ref Range   Glucose-Capillary 175 (H) 70 - 99 mg/dL  CBC     Status: Abnormal   Collection  Time: 03/05/15  4:30 AM  Result Value Ref Range   WBC 4.9 4.0 - 10.5 K/uL   RBC 4.20 (L) 4.22 - 5.81 MIL/uL   Hemoglobin 11.4 (L) 13.0 - 17.0 g/dL   HCT 34.8 (L) 39.0 - 52.0 %   MCV 82.9 78.0 - 100.0 fL   MCH 27.1 26.0 - 34.0 pg   MCHC 32.8 30.0 - 36.0 g/dL   RDW 13.6 11.5 - 15.5 %   Platelets 185 150 - 400 K/uL  Comprehensive metabolic panel     Status: Abnormal   Collection Time: 03/05/15  4:30 AM  Result Value Ref Range   Sodium 138 135 - 145 mmol/L   Potassium 3.7 3.5 - 5.1 mmol/L   Chloride 103 96 - 112 mmol/L   CO2 28 19 - 32 mmol/L   Glucose, Bld 160 (H) 70 - 99 mg/dL   BUN 13 6 - 23 mg/dL   Creatinine, Ser 1.05 0.50 - 1.35 mg/dL   Calcium 8.3 (L) 8.4 - 10.5 mg/dL   Total Protein 5.9 (L) 6.0 - 8.3 g/dL   Albumin 3.3 (L) 3.5 - 5.2 g/dL   AST 48 (H) 0 - 37 U/L   ALT 109 (H) 0 - 53 U/L   Alkaline Phosphatase 89 39 - 117 U/L   Total Bilirubin 0.8 0.3 - 1.2 mg/dL   GFR calc non Af Amer 83 (L) >90 mL/min   GFR calc Af Amer >90 >90 mL/min    Comment: (NOTE) The eGFR has been calculated using the CKD EPI equation. This calculation has not been validated in all clinical situations. eGFR's persistently <90 mL/min signify possible Chronic Kidney Disease.    Anion gap 7 5 - 15  Glucose, capillary     Status: Abnormal   Collection Time: 03/05/15  7:39 AM  Result Value Ref Range   Glucose-Capillary 163 (H) 70 - 99 mg/dL   Comment 1 Notify RN    Comment 2 Document in Chart     Imaging / Studies: No results found.  Medications / Allergies:  Scheduled Meds: . acetaminophen  1,000 mg Oral  TID  . atorvastatin  40 mg Oral q1800  . aztreonam  2 g Intravenous 3 times per day  . bisacodyl  10 mg Rectal Daily  . enoxaparin (LOVENOX) injection  60 mg Subcutaneous Q24H  . insulin aspart  0-20 Units Subcutaneous TID WC  . insulin aspart  0-5 Units Subcutaneous QHS  . insulin glargine  5 Units Subcutaneous QHS  . lip balm  1 application Topical BID  . metFORMIN  1,000 mg Oral  BID WC  . pantoprazole  80 mg Oral BID   Continuous Infusions: . sodium chloride 50 mL/hr (03/04/15 1133)  . sodium chloride 0.9 % 1,000 mL with potassium chloride 20 mEq infusion Stopped (03/01/15 1535)   PRN Meds:.alum & mag hydroxide-simeth, diphenhydrAMINE, fentaNYL, hydrALAZINE, ketorolac, LORazepam, magic mouthwash, menthol-cetylpyridinium, methocarbamol (ROBAXIN)  IV, metoCLOPramide (REGLAN) injection, ondansetron (ZOFRAN) IV **OR** ondansetron (ZOFRAN) IV, ondansetron **OR** [DISCONTINUED] ondansetron (ZOFRAN) IV, oxyCODONE, phenol, promethazine  Antibiotics: Anti-infectives    Start     Dose/Rate Route Frequency Ordered Stop   03/03/15 1400  aztreonam (AZACTAM) 2 g in dextrose 5 % 50 mL IVPB     2 g 100 mL/hr over 30 Minutes Intravenous 3 times per day 03/03/15 1006     03/01/15 0600  aztreonam (AZACTAM) injection 2 g  Status:  Discontinued     2 g Intramuscular 3 times per day 03/01/15 0203 03/01/15 0207   03/01/15 0500  aztreonam (AZACTAM) 2 g in dextrose 5 % 50 mL IVPB  Status:  Discontinued     2 g 100 mL/hr over 30 Minutes Intravenous 3 times per day 03/01/15 0208 03/03/15 0823   03/01/15 0300  metroNIDAZOLE (FLAGYL) IVPB 500 mg  Status:  Discontinued     500 mg 100 mL/hr over 60 Minutes Intravenous 3 times per day 03/01/15 0203 03/03/15 0853        Assessment/Plan POD#4 laparoscopic cholecystectomy  And liver biopsy---Dr. Johney Maine -LFTs are improving, WBC normal.  Having BMs.  Some nausea, but tolerating solids.  Ambulating.  He still has a fair amount of abdominal pain, toradol helps and complains of nausea.  Low concern for bile leak given LFTs, but may need to consider if symptoms persist. -erythema at incision site, ?will he need atbx at DC -mobilize -SLIV -SCD/lovenox   Erby Pian, [facility] Pager [phone]([phone])   03/05/2015 11:12 AM

## 2015-03-06 LAB — CBC
HEMATOCRIT: 35 % — AB (ref 39.0–52.0)
Hemoglobin: 11.7 g/dL — ABNORMAL LOW (ref 13.0–17.0)
MCH: 27.5 pg (ref 26.0–34.0)
MCHC: 33.4 g/dL (ref 30.0–36.0)
MCV: 82.4 fL (ref 78.0–100.0)
Platelets: 201 10*3/uL (ref 150–400)
RBC: 4.25 MIL/uL (ref 4.22–5.81)
RDW: 13.6 % (ref 11.5–15.5)
WBC: 4.4 10*3/uL (ref 4.0–10.5)

## 2015-03-06 LAB — GLUCOSE, CAPILLARY
GLUCOSE-CAPILLARY: 142 mg/dL — AB (ref 70–99)
GLUCOSE-CAPILLARY: 182 mg/dL — AB (ref 70–99)
GLUCOSE-CAPILLARY: 194 mg/dL — AB (ref 70–99)
Glucose-Capillary: 175 mg/dL — ABNORMAL HIGH (ref 70–99)
Glucose-Capillary: 151 mg/dL — ABNORMAL HIGH (ref 70–99)

## 2015-03-06 LAB — COMPREHENSIVE METABOLIC PANEL
ALK PHOS: 92 U/L (ref 39–117)
ALT: 108 U/L — AB (ref 0–53)
ANION GAP: 8 (ref 5–15)
AST: 54 U/L — ABNORMAL HIGH (ref 0–37)
Albumin: 3.4 g/dL — ABNORMAL LOW (ref 3.5–5.2)
BUN: 16 mg/dL (ref 6–23)
CO2: 26 mmol/L (ref 19–32)
Calcium: 8.3 mg/dL — ABNORMAL LOW (ref 8.4–10.5)
Chloride: 103 mmol/L (ref 96–112)
Creatinine, Ser: 1.03 mg/dL (ref 0.50–1.35)
GFR calc non Af Amer: 85 mL/min — ABNORMAL LOW (ref >90)
Glucose, Bld: 162 mg/dL — ABNORMAL HIGH (ref 70–99)
Potassium: 3.5 mmol/L (ref 3.5–5.1)
Sodium: 137 mmol/L (ref 135–145)
TOTAL PROTEIN: 6.3 g/dL (ref 6.0–8.3)
Total Bilirubin: 0.9 mg/dL (ref 0.3–1.2)

## 2015-03-06 MED ORDER — LIDOCAINE HCL 1 % IJ SOLN
INTRAMUSCULAR | Status: AC
  Filled 2015-03-06: qty 20

## 2015-03-06 NOTE — Progress Notes (Signed)
PROGRESS NOTE  Joseph McgregorStephen [name] ZOX:[id number] DOB: 08/31/1966 DOA: 02/28/2015 PCP: Lesia HausenKUMMER,ANTHONY J, MD  HPI: 49 year old male with a history hyperlipidemia, diabetes mellitus, and hypertension presents with one-day history of colicky abdominal pain. 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 He was found to have acute cholecystitis s/p lap chole 3/24.  Subjective / 24 H Interval events - erythema/pain worse around surgical site  Assessment/Plan: Principal Problem:   Acute cholecystitis s/p lap chole 03/01/2015 Active Problems:   Type 2 diabetes mellitus with hyperglycemia   Essential hypertension   Hyperlipidemia  Abdominal pain associated with vomiting due to acute cholecystitis - s/p lap chole 3/24 - worsening abdominal pain overnight 3/25 - 3/26, improving since - LFTs better  Cellulitis at the surgical site - progressively worse in the past 1-2 days - suspect underlying abscess per surgery, wound opened bedside 3/29, cultures sent - continue IV antibiotics - allergic to Penicillin, Levaquin, probably needs few days of Bactrim on d/c   Diabetes mellitus type 2 - Hemoglobin A1c showing poor control - NovoLog sliding scale - low dose Lantus - discussed with patient about home insulin, he is quite resistant and chooses not to have insulin at home 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  - His endocrinologist is entertaining the idea of Invokana, will give him a prescription when he leaves the hospital.   Morbid obesity  - ongoing discussions about weight loss   Hypertension  - resumed lisinopril - Hydralazine IV prn SBP >180  Hyperlipidemia - On lipitor   Diet: Diet - low sodium heart healthy DIET SOFT Room service appropriate?: Yes; Fluid consistency:: Thin Fluids: none DVT Prophylaxis: Lovenox  Code Status: Full Code Family Communication: none bedside  Disposition Plan: inpatient, home when ready    Consultants:  General surgery   Procedures:  None    Antibiotics Aztreonam 3/24 >> Flagyl 3/24 >>   Studies  No results found. Objective  Filed Vitals:   03/05/15 0550 03/05/15 1353 03/05/15 2135 03/06/15 0615  BP: 154/88 160/97 159/88 157/87  Pulse: 59 73 73 63  Temp: 97.4 F (36.3 C) 98.5 F (36.9 C) 98.2 F (36.8 C) 98 F (36.7 C)  TempSrc: Oral Oral Oral Oral  Resp: 18 17 18 18   Height:      Weight:      SpO2: 98% 96% 97% 98%    Intake/Output Summary (Last 24 hours) at 03/06/15 1214 Last data filed at 03/06/15 0837  Gross per 24 hour  Intake 2848.33 ml  Output   2700 ml  Net 148.33 ml   Filed Weights   03/01/15 0210  Weight: 147.328 kg (324 lb 12.8 oz)   Exam:  General:  No apparent distress  HEENT: no scleral icterus  Cardiovascular: RRR without MRG  Respiratory: CTA biL, good air movement, no wheezing, no crackles, no rales  Abdomen: soft, less tender today, moderate bruising peri umbilical extending and evolving with cellulitic appearance  MSK/Extremities: no clubbing/cyanosis, no joint swelling  Data Reviewed: Basic Metabolic Panel:  Recent Labs Lab 03/02/15 0420 03/03/15 0445 03/04/15 0508 03/05/15 0430 03/06/15 0530  NA 142 136 137 138 137  K 4.6 3.8 3.5 3.7 3.5  CL 106 102 104 103 103  CO2 29 25 27 28 26   GLUCOSE 188* 212* 175* 160* 162*  BUN 19 13 13 13 16   CREATININE 1.38* 0.94 0.96 1.05 1.03  CALCIUM 7.8* 8.0* 8.0* 8.3* 8.3*   Liver  Function Tests:  Recent Labs Lab 03/02/15 0420 03/03/15 0445 03/04/15 0508 03/05/15 0430 03/06/15 0530  AST 182* 82* 34 48* 54*  ALT 230* 180* 124* 109* 108*  ALKPHOS 85 99 91 89 92  BILITOT 1.2 1.8* 1.0 0.8 0.9  PROT 6.0 6.3 6.2 5.9* 6.3  ALBUMIN 3.5 3.7 3.4* 3.3* 3.4*    Recent Labs Lab 02/28/15 1846  LIPASE 45   CBC:  Recent Labs Lab 02/28/15 1846 03/02/15 0420 03/04/15 0508 03/05/15 0430 03/06/15 0530  WBC 13.3* 5.2 4.3 4.9 4.4  NEUTROABS 11.6*  --   --    --   --   HGB 16.1 13.0 11.6* 11.4* 11.7*  HCT 47.3 40.1 35.0* 34.8* 35.0*  MCV 81.7 85.5 83.5 82.9 82.4  PLT 209 171 169 185 201   CBG:  Recent Labs Lab 03/05/15 1651 03/05/15 2130 03/06/15 0401 03/06/15 0806 03/06/15 1208  GLUCAP 223* 249* 142* 175* 182*   Scheduled Meds: . acetaminophen  1,000 mg Oral TID  . atorvastatin  40 mg Oral q1800  . aztreonam  2 g Intravenous 3 times per day  . bisacodyl  10 mg Rectal Daily  . enoxaparin (LOVENOX) injection  60 mg Subcutaneous Q24H  . insulin aspart  0-20 Units Subcutaneous TID WC  . insulin aspart  0-5 Units Subcutaneous QHS  . insulin glargine  5 Units Subcutaneous QHS  . lidocaine      . lip balm  1 application Topical BID  . lisinopril  10 mg Oral Daily  . metFORMIN  1,000 mg Oral BID WC  . pantoprazole  80 mg Oral BID  . sodium chloride  3 mL Intravenous Q12H   Continuous Infusions: . sodium chloride 0.9 % 1,000 mL with potassium chloride 20 mEq infusion Stopped (03/01/15 1535)   Time spent: 25 minutes  Pamella Pert, MD [facility] Pager [phone]. If 7 PM - 7 AM, please contact night-coverage at [email or web address], password [facility] 03/06/2015, 12:14 PM  LOS: 5 days

## 2015-03-06 NOTE — Procedures (Signed)
Incision and Drainage Procedure Note  Pre-operative Diagnosis: cellulitis of abdominal incision  Post-operative Diagnosis: hematoma  Indications: POD#5 laparoscopic cholecystectomy with continues erythema around this wound and concerns for developing abscess.  Anesthesia: 1% plain lidocaine  Procedure Details  The procedure, risks and complications have been discussed in detail (including, but not limited to airway compromise, infection, bleeding) with the patient, and the patient has verbally consented to the procedure.  The skin was sterilely prepped and draped over the affected area in the usual fashion. After adequate local anesthesia, I&D with a #11 blade was performed on the supraumbilical incision. He was found to have a hematoma which was evacuated.  Culture was obtained.  The wound was packed with a 4x4 guaze. The patient was observed until stable.  Findings: hematoma  EBL: 10 cc's  Drains: none  Condition: Tolerated procedure well and Stable   Complications: none.   Wanetta Funderburke, ANP-BC

## 2015-03-06 NOTE — Progress Notes (Signed)
Patient ID: Joseph [name], male   DOB: 1966-09-24, 49 y.o.   MRN: [id number]     [facility]      [address] [place], [place] [address]    Phone: [phone] FAX: [phone]     Subjective: Continues erythema, worse.  Afebrile.  VSS.  Sore, but stable overall.  Tolerating PO and having BMs.  Ambulating.    Objective:  Vital signs:  Filed Vitals:   03/05/15 0550 03/05/15 1353 03/05/15 2135 03/06/15 0615  BP: 154/88 160/97 159/88 157/87  Pulse: 59 73 73 63  Temp: 97.4 F (36.3 C) 98.5 F (36.9 C) 98.2 F (36.8 C) 98 F (36.7 C)  TempSrc: Oral Oral Oral Oral  Resp: $Remo'18 17 18 18  'bGhVj$ Height:      Weight:      SpO2: 98% 96% 97% 98%    Last BM Date: 03/06/15  Intake/Output   Yesterday:  03/28 0701 - 03/29 0700 In: 2848.3 [P.O.:1440; I.V.:1358.3; IV Piggyback:50] Out: 2900 [Urine:2900] This shift:  Total I/O In: -  Out: 800 [Urine:800]  Physical Exam: General: Pt awake/alert/oriented x4 in no acute distress  Abdomen: Soft. Nondistended. Mild TTP at incision site and upper abdomen. Umbilical incision--mild erythema.  After obtaining verbal consent, I opened up the wound which showed a hematoma.  Formal procedure note to follow.   No evidence of peritonitis. No incarcerated hernias.   Problem List:   Principal Problem:   Acute cholecystitis s/p lap chole 03/01/2015 Active Problems:   Type 2 diabetes mellitus with hyperglycemia   Essential hypertension   Hyperlipidemia    Results:   Labs: Results for orders placed or performed during the hospital encounter of 02/28/15 (from the past 48 hour(s))  Glucose, capillary     Status: Abnormal   Collection Time: 03/04/15  2:27 PM  Result Value Ref Range   Glucose-Capillary 163 (H) 70 - 99 mg/dL  Glucose, capillary     Status: Abnormal   Collection Time: 03/04/15  5:12 PM  Result Value Ref Range   Glucose-Capillary 172 (H) 70 - 99 mg/dL   Comment 1 Document in  Chart    Comment 2 Repeat Test   Glucose, capillary     Status: Abnormal   Collection Time: 03/04/15  9:51 PM  Result Value Ref Range   Glucose-Capillary 175 (H) 70 - 99 mg/dL  CBC     Status: Abnormal   Collection Time: 03/05/15  4:30 AM  Result Value Ref Range   WBC 4.9 4.0 - 10.5 K/uL   RBC 4.20 (L) 4.22 - 5.81 MIL/uL   Hemoglobin 11.4 (L) 13.0 - 17.0 g/dL   HCT 34.8 (L) 39.0 - 52.0 %   MCV 82.9 78.0 - 100.0 fL   MCH 27.1 26.0 - 34.0 pg   MCHC 32.8 30.0 - 36.0 g/dL   RDW 13.6 11.5 - 15.5 %   Platelets 185 150 - 400 K/uL  Comprehensive metabolic panel     Status: Abnormal   Collection Time: 03/05/15  4:30 AM  Result Value Ref Range   Sodium 138 135 - 145 mmol/L   Potassium 3.7 3.5 - 5.1 mmol/L   Chloride 103 96 - 112 mmol/L   CO2 28 19 - 32 mmol/L   Glucose, Bld 160 (H) 70 - 99 mg/dL   BUN 13 6 - 23 mg/dL   Creatinine, Ser 1.05 0.50 - 1.35 mg/dL   Calcium 8.3 (L) 8.4 - 10.5 mg/dL   Total  Protein 5.9 (L) 6.0 - 8.3 g/dL   Albumin 3.3 (L) 3.5 - 5.2 g/dL   AST 48 (H) 0 - 37 U/L   ALT 109 (H) 0 - 53 U/L   Alkaline Phosphatase 89 39 - 117 U/L   Total Bilirubin 0.8 0.3 - 1.2 mg/dL   GFR calc non Af Amer 83 (L) >90 mL/min   GFR calc Af Amer >90 >90 mL/min    Comment: (NOTE) The eGFR has been calculated using the CKD EPI equation. This calculation has not been validated in all clinical situations. eGFR's persistently <90 mL/min signify possible Chronic Kidney Disease.    Anion gap 7 5 - 15  Glucose, capillary     Status: Abnormal   Collection Time: 03/05/15  7:39 AM  Result Value Ref Range   Glucose-Capillary 163 (H) 70 - 99 mg/dL   Comment 1 Notify RN    Comment 2 Document in Chart   Glucose, capillary     Status: Abnormal   Collection Time: 03/05/15 12:18 PM  Result Value Ref Range   Glucose-Capillary 197 (H) 70 - 99 mg/dL   Comment 1 Notify RN    Comment 2 Document in Chart   Glucose, capillary     Status: Abnormal   Collection Time: 03/05/15  4:51 PM  Result  Value Ref Range   Glucose-Capillary 223 (H) 70 - 99 mg/dL  Glucose, capillary     Status: Abnormal   Collection Time: 03/05/15  9:30 PM  Result Value Ref Range   Glucose-Capillary 249 (H) 70 - 99 mg/dL  Glucose, capillary     Status: Abnormal   Collection Time: 03/06/15  4:01 AM  Result Value Ref Range   Glucose-Capillary 142 (H) 70 - 99 mg/dL  CBC     Status: Abnormal   Collection Time: 03/06/15  5:30 AM  Result Value Ref Range   WBC 4.4 4.0 - 10.5 K/uL   RBC 4.25 4.22 - 5.81 MIL/uL   Hemoglobin 11.7 (L) 13.0 - 17.0 g/dL   HCT 35.0 (L) 39.0 - 52.0 %   MCV 82.4 78.0 - 100.0 fL   MCH 27.5 26.0 - 34.0 pg   MCHC 33.4 30.0 - 36.0 g/dL   RDW 13.6 11.5 - 15.5 %   Platelets 201 150 - 400 K/uL  Comprehensive metabolic panel     Status: Abnormal   Collection Time: 03/06/15  5:30 AM  Result Value Ref Range   Sodium 137 135 - 145 mmol/L   Potassium 3.5 3.5 - 5.1 mmol/L   Chloride 103 96 - 112 mmol/L   CO2 26 19 - 32 mmol/L   Glucose, Bld 162 (H) 70 - 99 mg/dL   BUN 16 6 - 23 mg/dL   Creatinine, Ser 1.03 0.50 - 1.35 mg/dL   Calcium 8.3 (L) 8.4 - 10.5 mg/dL   Total Protein 6.3 6.0 - 8.3 g/dL   Albumin 3.4 (L) 3.5 - 5.2 g/dL   AST 54 (H) 0 - 37 U/L   ALT 108 (H) 0 - 53 U/L   Alkaline Phosphatase 92 39 - 117 U/L   Total Bilirubin 0.9 0.3 - 1.2 mg/dL   GFR calc non Af Amer 85 (L) >90 mL/min   GFR calc Af Amer >90 >90 mL/min    Comment: (NOTE) The eGFR has been calculated using the CKD EPI equation. This calculation has not been validated in all clinical situations. eGFR's persistently <90 mL/min signify possible Chronic Kidney Disease.    Anion gap 8  5 - 15  Glucose, capillary     Status: Abnormal   Collection Time: 03/06/15  8:06 AM  Result Value Ref Range   Glucose-Capillary 175 (H) 70 - 99 mg/dL    Imaging / Studies: No results found.  Medications / Allergies:  Scheduled Meds: . acetaminophen  1,000 mg Oral TID  . atorvastatin  40 mg Oral q1800  . aztreonam  2 g  Intravenous 3 times per day  . bisacodyl  10 mg Rectal Daily  . enoxaparin (LOVENOX) injection  60 mg Subcutaneous Q24H  . insulin aspart  0-20 Units Subcutaneous TID WC  . insulin aspart  0-5 Units Subcutaneous QHS  . insulin glargine  5 Units Subcutaneous QHS  . lidocaine      . lip balm  1 application Topical BID  . lisinopril  10 mg Oral Daily  . metFORMIN  1,000 mg Oral BID WC  . pantoprazole  80 mg Oral BID  . sodium chloride  3 mL Intravenous Q12H   Continuous Infusions: . sodium chloride 0.9 % 1,000 mL with potassium chloride 20 mEq infusion Stopped (03/01/15 1535)   PRN Meds:.sodium chloride, alum & mag hydroxide-simeth, diphenhydrAMINE, fentaNYL, hydrALAZINE, ketorolac, LORazepam, magic mouthwash, menthol-cetylpyridinium, methocarbamol (ROBAXIN)  IV, metoCLOPramide (REGLAN) injection, ondansetron (ZOFRAN) IV **OR** ondansetron (ZOFRAN) IV, ondansetron **OR** [DISCONTINUED] ondansetron (ZOFRAN) IV, oxyCODONE, phenol, promethazine, sodium chloride  Antibiotics: Anti-infectives    Start     Dose/Rate Route Frequency Ordered Stop   03/03/15 1400  aztreonam (AZACTAM) 2 g in dextrose 5 % 50 mL IVPB     2 g 100 mL/hr over 30 Minutes Intravenous 3 times per day 03/03/15 1006     03/01/15 0600  aztreonam (AZACTAM) injection 2 g  Status:  Discontinued     2 g Intramuscular 3 times per day 03/01/15 0203 03/01/15 0207   03/01/15 0500  aztreonam (AZACTAM) 2 g in dextrose 5 % 50 mL IVPB  Status:  Discontinued     2 g 100 mL/hr over 30 Minutes Intravenous 3 times per day 03/01/15 0208 03/03/15 0823   03/01/15 0300  metroNIDAZOLE (FLAGYL) IVPB 500 mg  Status:  Discontinued     500 mg 100 mL/hr over 60 Minutes Intravenous 3 times per day 03/01/15 0203 03/03/15 0853        Assessment/Plan POD#5 laparoscopic cholecystectomy And liver biopsy---Dr. Johney Maine Post op hematoma, wound opened a tbedside  -start daily wet to dry dressing changes -obtain wound culture -re-evaluate erythema and  wound tomorrow, if stable, then DC home with HH -mobilize -IS -pain controlled -mobilize -SLIV -SCD/lovenox  Erby Pian, [facility] Pager [phone])   03/06/2015 11:07 AM

## 2015-03-06 NOTE — Progress Notes (Signed)
ANTIBIOTIC CONSULT NOTE - follow up  Pharmacy Consult for Aztreonam Indication: Cholecystitis, s/p cholecystectomy  Allergies  Allergen Reactions  . Penicillins Anaphylaxis  . Levaquin [Levofloxacin] Other (See Comments)    tendonitis  . Erythromycin Rash    Azithromycin okay   Patient Measurements: Height: 6\' 3"  (190.5 cm) Weight: (!) 324 lb 12.8 oz (147.328 kg) IBW/kg (Calculated) : 84.5  Vital Signs: Temp: 98 F (36.7 C) (03/29 0615) Temp Source: Oral (03/29 0615) BP: 157/87 mmHg (03/29 0615) Pulse Rate: 63 (03/29 0615) Intake/Output from previous day: 03/28 0701 - 03/29 0700 In: 2848.3 [P.O.:1440; I.V.:1358.3; IV Piggyback:50] Out: 2900 [Urine:2900] Intake/Output from this shift: Total I/O In: -  Out: 800 [Urine:800]  Labs:  Recent Labs  03/04/15 0508 03/05/15 0430 03/06/15 0530  WBC 4.3 4.9 4.4  HGB 11.6* 11.4* 11.7*  PLT 169 185 201  CREATININE 0.96 1.05 1.03   Estimated Creatinine Clearance: 138.9 mL/min (by C-G formula based on Cr of 1.03). No results for input(s): VANCOTROUGH, VANCOPEAK, VANCORANDOM, GENTTROUGH, GENTPEAK, GENTRANDOM, TOBRATROUGH, TOBRAPEAK, TOBRARND, AMIKACINPEAK, AMIKACINTROU, AMIKACIN in the last 72 hours.   Microbiology: Recent Results (from the past 720 hour(s))  Culture, blood (routine x 2)     Status: None (Preliminary result)   Collection Time: 03/01/15  2:25 AM  Result Value Ref Range Status   Specimen Description BLOOD RIGHT ARM  Final   Special Requests BOTTLES DRAWN AEROBIC ONLY 5CC  Final   Culture   Final           BLOOD CULTURE RECEIVED NO GROWTH TO DATE CULTURE WILL BE HELD FOR 5 DAYS BEFORE ISSUING A FINAL NEGATIVE REPORT Performed at [facility]    Report Status PENDING  Incomplete  Culture, blood (routine x 2)     Status: None (Preliminary result)   Collection Time: 03/01/15  2:30 AM  Result Value Ref Range Status   Specimen Description BLOOD RIGHT FOREARM  Final   Special Requests BOTTLES DRAWN  AEROBIC ONLY 5CC  Final   Culture   Final           BLOOD CULTURE RECEIVED NO GROWTH TO DATE CULTURE WILL BE HELD FOR 5 DAYS BEFORE ISSUING A FINAL NEGATIVE REPORT Performed at [facility]    Report Status PENDING  Incomplete  Surgical pcr screen     Status: Abnormal   Collection Time: 03/01/15  2:44 PM  Result Value Ref Range Status   MRSA, PCR NEGATIVE NEGATIVE Final   Staphylococcus aureus POSITIVE (A) NEGATIVE Final    Comment:        The Xpert SA Assay (FDA approved for NASAL specimens in patients over 47 years of age), is one component of a comprehensive surveillance program.  Test performance has been validated by [facility] for patients greater than or equal to 47 year old. It is not intended to diagnose infection nor to guide or monitor treatment.    Medical History: Past Medical History  Diagnosis Date  . GERD (gastroesophageal reflux disease)   . Diabetes mellitus   . Hypertension   . Renal disorder     Kidney Stones   Medications:  Scheduled:  . acetaminophen  1,000 mg Oral TID  . atorvastatin  40 mg Oral q1800  . aztreonam  2 g Intravenous 3 times per day  . bisacodyl  10 mg Rectal Daily  . enoxaparin (LOVENOX) injection  60 mg Subcutaneous Q24H  . insulin aspart  0-20 Units Subcutaneous TID WC  . insulin aspart  0-5 Units Subcutaneous QHS  . insulin glargine  5 Units Subcutaneous QHS  . lip balm  1 application Topical BID  . lisinopril  10 mg Oral Daily  . metFORMIN  1,000 mg Oral BID WC  . pantoprazole  80 mg Oral BID  . sodium chloride  3 mL Intravenous Q12H   Anti-infectives    Start     Dose/Rate Route Frequency Ordered Stop   03/03/15 1400  aztreonam (AZACTAM) 2 g in dextrose 5 % 50 mL IVPB     2 g 100 mL/hr over 30 Minutes Intravenous 3 times per day 03/03/15 1006     03/01/15 0600  aztreonam (AZACTAM) injection 2 g  Status:  Discontinued     2 g Intramuscular 3 times per day 03/01/15 0203 03/01/15 0207   03/01/15 0500  aztreonam  (AZACTAM) 2 g in dextrose 5 % 50 mL IVPB  Status:  Discontinued     2 g 100 mL/hr over 30 Minutes Intravenous 3 times per day 03/01/15 0208 03/03/15 0823   03/01/15 0300  metroNIDAZOLE (FLAGYL) IVPB 500 mg  Status:  Discontinued     500 mg 100 mL/hr over 60 Minutes Intravenous 3 times per day 03/01/15 0203 03/03/15 0853     Assessment: 49 yoM s/p lap cholecystectomy 3/24, acute on chronic cholecystitis. LFT's elevated, probable steatohepatitis, liver biopsy completed. Day 3 Aztreonam begun 3/24, completed 2 days Flagyl. Low grade temps, worsening abd pain. Pharmacy requested to dose Azactam, require minimun 3 days therapy  3/24 >> Azactam >> 3/24 >> Flagyl >> 3/26   Tmax: afeb WBCs: WNL Renal: SCr 1.03 stable, CrCl > 100  3/24 blood x2: ngtd  Goal of Therapy:  Antiobiotic dose/schedule appropriate for treatment and renal function  Plan: Day 6 Azactam  Continue current dosing of Azactam.   What is plan for length of abx's as patient vitals and labs stable?   Hessie Knows, PharmD, BCPS Pager [phone] 03/06/2015 9:52 AM

## 2015-03-07 LAB — CULTURE, BLOOD (ROUTINE X 2)
Culture: NO GROWTH
Culture: NO GROWTH

## 2015-03-07 LAB — COMPREHENSIVE METABOLIC PANEL
ALK PHOS: 100 U/L (ref 39–117)
ALT: 188 U/L — ABNORMAL HIGH (ref 0–53)
ANION GAP: 8 (ref 5–15)
AST: 155 U/L — ABNORMAL HIGH (ref 0–37)
Albumin: 3.6 g/dL (ref 3.5–5.2)
BUN: 9 mg/dL (ref 6–23)
CALCIUM: 8.5 mg/dL (ref 8.4–10.5)
CO2: 25 mmol/L (ref 19–32)
Chloride: 104 mmol/L (ref 96–112)
Creatinine, Ser: 0.92 mg/dL (ref 0.50–1.35)
GFR calc non Af Amer: >90 mL/min (ref >90)
Glucose, Bld: 180 mg/dL — ABNORMAL HIGH (ref 70–99)
POTASSIUM: 3.7 mmol/L (ref 3.5–5.1)
Sodium: 137 mmol/L (ref 135–145)
TOTAL PROTEIN: 6.7 g/dL (ref 6.0–8.3)
Total Bilirubin: 0.7 mg/dL (ref 0.3–1.2)

## 2015-03-07 LAB — CBC
HCT: 37.3 % — ABNORMAL LOW (ref 39.0–52.0)
Hemoglobin: 12.4 g/dL — ABNORMAL LOW (ref 13.0–17.0)
MCH: 27.6 pg (ref 26.0–34.0)
MCHC: 33.2 g/dL (ref 30.0–36.0)
MCV: 82.9 fL (ref 78.0–100.0)
PLATELETS: 228 10*3/uL (ref 150–400)
RBC: 4.5 MIL/uL (ref 4.22–5.81)
RDW: 13.7 % (ref 11.5–15.5)
WBC: 5.7 10*3/uL (ref 4.0–10.5)

## 2015-03-07 LAB — GLUCOSE, CAPILLARY
GLUCOSE-CAPILLARY: 195 mg/dL — AB (ref 70–99)
Glucose-Capillary: 177 mg/dL — ABNORMAL HIGH (ref 70–99)

## 2015-03-07 MED ORDER — ONDANSETRON HCL 4 MG PO TABS: 4.0000 mg | ORAL_TABLET | Freq: Four times a day (QID) | ORAL | Status: DC | PRN

## 2015-03-07 MED ORDER — OXYCODONE HCL 5 MG PO TABS: 5.0000 mg | ORAL_TABLET | ORAL | Status: DC | PRN

## 2015-03-07 MED ORDER — CLINDAMYCIN HCL 300 MG PO CAPS: 300.0000 mg | ORAL_CAPSULE | Freq: Three times a day (TID) | ORAL | Status: DC

## 2015-03-07 NOTE — Care Management Note (Signed)
    Page 1 of 1   03/07/2015     12:41:16 PM CARE MANAGEMENT NOTE 03/07/2015  Patient:  Joseph [name],Joseph [name]   Account Number:  [id number]  Date Initiated:  03/01/2015  Documentation initiated by:  Lorenda IshiharaPEELE,Gussie Murton  Subjective/Objective Assessment:   49 yo male admitted with acute cholecystitis. PTA lived at home with spouse.     Action/Plan:   Home when stable   Anticipated DC Date:  03/06/2015   Anticipated DC Plan:  HOME W HOME HEALTH SERVICES      DC Planning Services  CM consult      [facility]   Choice offered to / List presented to:  C-1 Patient        HH arranged  HH-1 RN  HH-10 DISEASE MANAGEMENT      HH agency  [facility]   Status of service:  Completed, signed off Medicare Important Message given?   (If response is "NO", the following Medicare IM given date fields will be blank) Date Medicare IM given:   Medicare IM given by:   Date Additional Medicare IM given:   Additional Medicare IM given by:    Discharge Disposition:  HOME W HOME HEALTH SERVICES  Per UR Regulation:  Reviewed for med. necessity/level of care/duration of stay  If discussed at Long Length of Stay Meetings, dates discussed:    Comments:

## 2015-03-07 NOTE — Discharge Summary (Signed)
Physician Discharge Summary  Joseph [name] ZOX:[id number] DOB: [birth year] DOA: 02/28/2015  PCP: Lesia Hausen, MD  Admit date: 02/28/2015 Discharge date: 03/07/2015  Time spent: 35 minutes  Recommendations for Outpatient Follow-up:  1. Please follow-up on liver function tests on hospital follow-up visit, labs on 03/07/2015 showed AST of 155 and ALT of 188  2. Home health services for home RN ordered for management of abdominal surgical wound 3. General surgery recommending wet-to-dry dressing changes daily  Discharge Diagnoses:  Principal Problem:   Acute cholecystitis s/p lap chole 03/01/2015 Active Problems:   Type 2 diabetes mellitus with hyperglycemia   Essential hypertension   Hyperlipidemia   Discharge Condition: Stable  Diet recommendation: Heart healthy  Filed Weights   03/01/15 0210  Weight: 147.328 kg (324 lb 12.8 oz)    History of present illness:  49 year old male with a history hyperlipidemia, diabetes mellitus, and hypertension presents with one-day history of colicky abdominal pain. The patient works as a Radiation protection practitioner for [employer]. While at work on the day of admission, the patient developed right lower quadrant abdominal pain with associated nausea and vomiting that was acute in onset. Because the abdominal pain became more frequent and more severe, the patient came to the emergency department for further evaluation. The patient has been in his usual state of health prior to today's episode. He has not had any fevers, chills, chest pain, shortness breath, abdominal pain, diarrhea, dysuria, hematuria. The patient had a number of episodes of emesis without any blood. During his ED stay, the patient's abdominal pain became more right upper quadrant. CT of the abdomen and pelvis was obtained and showed mildly distended gallbladder with dependent gallstones. There was hepatosplenomegaly. There was fluid filled but normal caliber bowel loops without inflammation. It was  diverticulosis without diverticulitis.  Hospital Course:  Patient is a 49 year old with a past medical history of dyslipidemia, hypertension, diabetes mellitus admitted to the medicine service on 03/01/2015 presented with complaints of abdominal pain. CT scan of abdomen and pelvis revealed presence of gallstones. He was further worked up with a HIDA scan that revealed cystic duct obstruction with cholecystitis. General surgery was consulted as he was taken to the operating room on 03/01/2015 undergoing laparoscopic cholecystectomy. While in the operating room he slid off the bed that did not result in injury. OR staff immediately repositioned him. On the following day he complained of discomfort over the periumbilical region radiating to his side. Symptoms likely secondary to the development of postoperative hematoma likely complicated by cellulitis. He was treated with IV antibiotic therapy; aztreonam. He underwent incision and drainage of hematoma on 03/06/2015 as Gen. surgery continued to follow patient throughout his hospitalization. On 03/07/2015 general surgery felt that he was stable for discharge from their standpoint with 5 days of oral antibiotic therapy. Patient having a history of penicillin and fluoroquinolone allergy. He was discharged on clindamycin 3 mg by mouth 3 times a day for 5 days.   Procedures:  Laparoscopic cholecystectomy performed on 03/01/2015  Evacuation of hematoma performed on 03/06/2015  Consultations:  General surgery  Discharge Exam: Filed Vitals:   03/07/15 0515  BP: 169/95  Pulse: 67  Temp: 98.5 F (36.9 C)  Resp: 18    General: Patient is in no acute distress, he is awake and alert Cardiovascular: Regular rate and rhythm normal S1-S2 no murmurs rubs or gallops Respiratory: Normal respiratory effort, lungs are clear Abdomen: Abdominal wound was inspected, wound is packed, there is no evidence of purulence or hematoma  Discharge  Instructions   Discharge Instructions    Call MD for:  difficulty breathing, headache or visual disturbances    Complete by:  As directed      Call MD for:  extreme fatigue    Complete by:  As directed      Call MD for:  extreme fatigue    Complete by:  As directed      Call MD for:  hives    Complete by:  As directed      Call MD for:  hives    Complete by:  As directed      Call MD for:  persistant dizziness or light-headedness    Complete by:  As directed      Call MD for:  persistant nausea and vomiting    Complete by:  As directed      Call MD for:  persistant nausea and vomiting    Complete by:  As directed      Call MD for:  redness, tenderness, or signs of infection (pain, swelling, redness, odor or green/yellow discharge around incision site)    Complete by:  As directed      Call MD for:  redness, tenderness, or signs of infection (pain, swelling, redness, odor or green/yellow discharge around incision site)    Complete by:  As directed      Call MD for:  severe uncontrolled pain    Complete by:  As directed      Call MD for:  severe uncontrolled pain    Complete by:  As directed      Call MD for:  temperature >100.4    Complete by:  As directed      Call MD for:    Complete by:  As directed   Temperature > 101.30F     Diet - low sodium heart healthy    Complete by:  As directed      Diet - low sodium heart healthy    Complete by:  As directed      Discharge instructions    Complete by:  As directed   Please see discharge instruction sheets.  Also refer to handout given an office.  Please call our office if you have any questions or concerns [phone]     Discharge wound care:    Complete by:  As directed   If you have closed incisions, shower and bathe over these incisions with soap and water every day.  Remove all surgical dressings on postoperative day #3.  You do not need to replace dressings over the closed incisions unless you feel more comfortable with a  Band-Aid covering it.   If you have an open wound that requires packing, please see wound care instructions.  In general, remove all dressings, wash wound with soap and water and then replace with saline moistened gauze.  Do the dressing change at least every day.  Please call our office [phone] if you have further questions.     Driving Restrictions    Complete by:  As directed   No driving until off narcotics and can safely swerve away without pain during an emergency     Increase activity slowly    Complete by:  As directed   Walk an hour a day.  Use 20-30 minute walks.  When you can walk 30 minutes without difficulty, increase to low impact/moderate activities such as biking, jogging, swimming, sexual activity..  Eventually can increase to unrestricted activity when  not feeling pain.  If you feel pain: STOP!Marland Kitchen   Let pain protect you from overdoing it.  Use ice/heat/over-the-counter pain medications to help minimize his soreness.  Use pain prescriptions as needed to remain active.  It is better to take extra pain medications and be more active than to stay bedridden to avoid all pain medications.     Increase activity slowly    Complete by:  As directed      Lifting restrictions    Complete by:  As directed   Avoid heavy lifting initially.  Do not push through pain.  You have no specific weight limit.  Coughing and sneezing or four more stressful to your incision than any lifting you will do. Pain will protect you from injury.  Therefore, avoid intense activity until off all narcotic pain medications.  Coughing and sneezing or four more stressful to your incision than any lifting he will do.     May shower / Bathe    Complete by:  As directed      May walk up steps    Complete by:  As directed      Sexual Activity Restrictions    Complete by:  As directed   Sexual activity as tolerated.  Do not push through pain.  Pain will protect you from injury.     Walk with assistance    Complete  by:  As directed   Walk over an hour a day.  May use a walker/cane/companion to help with balance and stamina.          Current Discharge Medication List    START taking these medications   Details  clindamycin (CLEOCIN) 300 MG capsule Take 1 capsule (300 mg total) by mouth 3 (three) times daily. Qty: 15 capsule, Refills: 0    ondansetron (ZOFRAN) 4 MG tablet Take 1 tablet (4 mg total) by mouth every 6 (six) hours as needed for nausea. Qty: 20 tablet, Refills: 0    oxyCODONE (OXY IR/ROXICODONE) 5 MG immediate release tablet Take 1-2 tablets (5-10 mg total) by mouth every 4 (four) hours as needed for moderate pain, severe pain or breakthrough pain. Qty: 20 tablet, Refills: 0      CONTINUE these medications which have NOT CHANGED   Details  glipiZIDE (GLUCOTROL) 5 MG tablet Take 5 mg by mouth 2 (two) times daily before a meal.    lisinopril (PRINIVIL,ZESTRIL) 10 MG tablet Take 10 mg by mouth every morning.    metFORMIN (GLUCOPHAGE) 500 MG tablet Take 1,000 mg by mouth 2 (two) times daily with a meal.    Multiple Vitamins-Minerals (DIABETES HEALTH FORMULA PO) Take 1 tablet by mouth daily.    omeprazole (PRILOSEC) 20 MG capsule Take 20 mg by mouth daily.    Specialty Vitamins Products (VITA-RX DIABETIC VITAMIN PO) Take 5 tablets by mouth daily.      STOP taking these medications     atorvastatin (LIPITOR) 40 MG tablet        Allergies  Allergen Reactions  . Penicillins Anaphylaxis  . Levaquin [Levofloxacin] Other (See Comments)    tendonitis  . Erythromycin Rash    Azithromycin okay   Follow-up Information    Follow up with GROSS,STEVEN C., MD. Call in 2 weeks.   Specialty:  General Surgery   Contact information:   [address] [place] [address] [phone]       Follow up with Lesia Hausen, MD In 1 week.   Specialty:  Internal  Medicine   Contact information:   [address] [place] [id number] [phone]        The  results of significant diagnostics from this hospitalization (including imaging, microbiology, ancillary and laboratory) are listed below for reference.    Significant Diagnostic Studies: Dg Shoulder Right  03/02/2015   CLINICAL DATA:  Status post fall with right shoulder pain  EXAM: RIGHT SHOULDER - 2+ VIEW  COMPARISON:  None.  FINDINGS: The bones of the right shoulder are adequately mineralized. The glenohumeral and AC joints are unremarkable. There is no acute fracture. The observed portions of the right clavicle and upper right ribs are normal. The overlying soft tissues are unremarkable. There is subsegmental atelectasis or scarring in the right mid lung.  IMPRESSION: There is no acute bony abnormality of the right shoulder.   Electronically Signed   By: David  [place]azil[place]   On: 03/02/2015 14:21   Nm Hepatobiliary Including Gb  03/01/2015   CLINICAL DATA:  Evaluate for acute cholecystitis. Increased liver function test, gallstones and abdominal pain.  EXAM: NUCLEAR MEDICINE HEPATOBILIARY IMAGING  TECHNIQUE: Sequential images of the abdomen were obtained out to 60 minutes following intravenous administration of radiopharmaceutical.  RADIOPHARMACEUTICALS:  6.0 Millicurie Tc-3156m Choletec  COMPARISON:  None.  FINDINGS: Following the IV administration of the radiopharmaceutical there is uniform tracer uptake by the liver with clearance from blood pool. After 20 minutes there is activity in the common bile duct and small bowel. After 60 minutes there is no radiotracer activity identified within the gallbladder. 6 mg of morphine sulfate was administered intravenously and subsequent imaging was carried out for an additional 1 hour. No radiotracer activity identified within the gallbladder on delayed imaging and following the IV administration of morphine.  IMPRESSION: 1. Non visualization of the gallbladder. Findings compatible with cystic duct obstruction and cholecystitis.   Electronically Signed   By: Signa Kellaylor   Stroud M.D.   On: 03/01/2015 13:57   Ct Abdomen Pelvis W Contrast  02/28/2015   CLINICAL DATA:  Upper abdominal pain, nausea and vomiting since this afternoon.  EXAM: CT ABDOMEN AND PELVIS WITH CONTRAST  TECHNIQUE: Multidetector CT imaging of the abdomen and pelvis was performed using the standard protocol following bolus administration of intravenous contrast.  CONTRAST:  100mL OMNIPAQUE IOHEXOL 300 MG/ML  SOLN  COMPARISON:  None.  FINDINGS: Minimal atelectasis in the lingula.  Lung bases are otherwise clear.  The liver is enlarged measuring 24.3 cm in craniocaudal dimension with diffusely decreased density. There is no focal hepatic lesion. Gallbladder is minimally distended, suspect small dependent gallstones. No gallbladder wall thickening. No biliary dilatation. The spleen is enlarged measuring 18.4 x 15.1 x 7.3 cm. No focal splenic lesion.  Pancreas, adrenal glands, and kidneys are normal. Symmetric renal enhancement and excretion.  Stomach is mildly distended with ingested contents. Fluid distending normal caliber small bowel loops. Minimal fluid in the cecum and ascending colon. Small volume of stool throughout the remainder of the colon. There is minimal diverticulosis of the distal colon without diverticulitis. No bowel wall thickening or dilatation. The appendix is normal. No free air, free fluid, or intra-abdominal fluid collection.  The abdominal aorta is normal in caliber. No retroperitoneal adenopathy.  In the pelvis the urinary bladder is physiologically distended. Prostate gland is normal in size. There is fat in the left inguinal canal. No pelvic free fluid or adenopathy. There is mild degenerative change at the lumbosacral and thoracolumbar junction in the spine. No acute or suspicious osseous abnormalities.  IMPRESSION: 1. Fluid-filled normal caliber bowel loops without associated bowel inflammation, may reflect mild enteritis. 2. Mild diverticulosis of the distal colon without diverticulitis.  3. Hepatosplenomegaly and hepatic steatosis. Probable small gallstones within physiologically distended gallbladder.   Electronically Signed   By: Rubye Oaks M.D.   On: 02/28/2015 22:03   US Abdomen Limited Ruq  03/01/2015   CLINICAL DATA:  Acute onset of right upper quadrant abdominal pain and vomiting. Initial encounter.  EXAM: US ABDOMEN LIMITED - RIGHT UPPER QUADRANT  COMPARISON:  None.  FINDINGS: Gallbladder:  A wall echo shadow sign is noted, with stones seen filling the gallbladder. No significant gallbladder wall thickening or pericholecystic fluid is seen. No ultrasonographic Murphy's sign is elicited, though this is not well assessed as the patient is on pain medication.  Common bile duct:  Diameter: 0.5 cm, within normal limits in caliber.  Liver:  No focal lesion identified. Diffusely increased parenchymal echogenicity and coarsened echotexture, compatible with fatty infiltration.  IMPRESSION: 1. Wall echo shadow sign noted, with stones filling the gallbladder. No definite evidence for obstruction or cholecystitis, though the gallbladder is difficult to fully assess assess given stones. 2. Diffuse fatty infiltration within the liver.   Electronically Signed   By: Roanna Raider M.D.   On: 03/01/2015 02:11    Microbiology: Recent Results (from the past 240 hour(s))  Culture, blood (routine x 2)     Status: None   Collection Time: 03/01/15  2:25 AM  Result Value Ref Range Status   Specimen Description BLOOD RIGHT ARM  Final   Special Requests BOTTLES DRAWN AEROBIC ONLY 5CC  Final   Culture   Final    NO GROWTH 5 DAYS Performed at [facility]    Report Status 03/07/2015 FINAL  Final  Culture, blood (routine x 2)     Status: None   Collection Time: 03/01/15  2:30 AM  Result Value Ref Range Status   Specimen Description BLOOD RIGHT FOREARM  Final   Special Requests BOTTLES DRAWN AEROBIC ONLY 5CC  Final   Culture   Final    NO GROWTH 5 DAYS Performed at [facility]    Report Status 03/07/2015 FINAL  Final  Surgical pcr screen     Status: Abnormal   Collection Time: 03/01/15  2:44 PM  Result Value Ref Range Status   MRSA, PCR NEGATIVE NEGATIVE Final   Staphylococcus aureus POSITIVE (A) NEGATIVE Final    Comment:        The Xpert SA Assay (FDA approved for NASAL specimens in patients over 21 years of age), is one component of a comprehensive surveillance program.  Test performance has been validated by [facility] for patients greater than or equal to 62 year old. It is not intended to diagnose infection nor to guide or monitor treatment.   Wound culture     Status: None (Preliminary result)   Collection Time: 03/06/15 10:00 AM  Result Value Ref Range Status   Specimen Description ABDOMEN WOUND  Final   Special Requests Normal  Final   Gram Stain   Final    NO WBC SEEN NO SQUAMOUS EPITHELIAL CELLS SEEN NO ORGANISMS SEEN Performed at [facility]    Culture PENDING  Incomplete   Report Status PENDING  Incomplete     Labs: Basic Metabolic Panel:  Recent Labs Lab 03/03/15 0445 03/04/15 0508 03/05/15 0430 03/06/15 0530 03/07/15 0554  NA 136 137 138 137 137  K 3.8 3.5 3.7  3.5 3.7  CL 102 104 103 103 104  CO2 GLUCOSE 212* 175* 160* 162* 180*  BUN CREATININE 0.94 0.96 1.05 1.03 0.92  CALCIUM 8.0* 8.0* 8.3* 8.3* 8.5   Liver Function Tests:  Recent Labs Lab 03/03/15 0445 03/04/15 0508 03/05/15 0430 03/06/15 0530 03/07/15 0554  AST 82* 34 48* 54* 155*  ALT 180* 124* 109* 108* 188*  ALKPHOS 99 91 89 92 100  BILITOT 1.8* 1.0 0.8 0.9 0.7  PROT 6.3 6.2 5.9* 6.3 6.7  ALBUMIN 3.7 3.4* 3.3* 3.4* 3.6    Recent Labs Lab 02/28/15 1846  LIPASE 45   No results for input(s): AMMONIA in the last 168 hours. CBC:  Recent Labs Lab 02/28/15 1846 03/02/15 0420 03/04/15 0508 03/05/15 0430 03/06/15 0530 03/07/15 0554  WBC 13.3* 5.2 4.3 4.9 4.4 5.7  NEUTROABS 11.6*  --   --    --   --   --   HGB 16.1 13.0 11.6* 11.4* 11.7* 12.4*  HCT 47.3 40.1 35.0* 34.8* 35.0* 37.3*  MCV 81.7 85.5 83.5 82.9 82.4 82.9  PLT 209 171 169 185 201 228   Cardiac Enzymes: No results for input(s): CKTOTAL, CKMB, CKMBINDEX, TROPONINI in the last 168 hours. BNP: BNP (last 3 results) No results for input(s): BNP in the last 8760 hours.  ProBNP (last 3 results) No results for input(s): PROBNP in the last 8760 hours.  CBG:  Recent Labs Lab 03/06/15 0806 03/06/15 1208 03/06/15 1704 03/06/15 2156 03/07/15 0736  GLUCAP 175* 182* 151* 194* 177*       Signed:  Jeralyn Bennett  [facility] 03/07/2015, 10:48 AM

## 2015-03-07 NOTE — Progress Notes (Signed)
Patient ID: Joseph [name], male   DOB: 14-Jul-1966, 49 y.o.   MRN: [id number]     [facility]      [address] [place], [place] [address]    Phone: [phone] FAX: [phone]     Subjective: Pain.  No n/v.  Labs reviewed,  Ast/alt up slightly, normal T bili  Objective:  Vital signs:  Filed Vitals:   03/06/15 0615 03/06/15 1330 03/06/15 2140 03/07/15 0515  BP: 157/87 178/93 171/94 169/95  Pulse: 63 71 67 67  Temp: 98 F (36.7 C) 98.2 F (36.8 C) 98.6 F (37 C) 98.5 F (36.9 C)  TempSrc: Oral Oral Oral Oral  Resp: $Remo'18 18 18 18  'JjVaq$ Height:      Weight:      SpO2: 98% 98% 99% 98%    Last BM Date: 03/06/15  Intake/Output   Yesterday:  03/29 0701 - 03/30 0700 In: 2040 [P.O.:2040] Out: 3000 [Urine:3000] This shift:    I/O last 3 completed shifts: In: 3383 [P.O.:3240; I.V.:1200; IV Piggyback:50] Out: 4500 [Urine:4500]    Physical Exam: General: Pt awake/alert/oriented x4 in no acute distress Abdomen: Soft.  Nondistended.   Packing removed, moderate pain, old hematoma, no purulent drainage, erythema is significantly better.   No evidence of peritonitis.  No incarcerated hernias.    Problem List:   Principal Problem:   Acute cholecystitis s/p lap chole 03/01/2015 Active Problems:   Type 2 diabetes mellitus with hyperglycemia   Essential hypertension   Hyperlipidemia    Results:   Labs: Results for orders placed or performed during the hospital encounter of 02/28/15 (from the past 48 hour(s))  Glucose, capillary     Status: Abnormal   Collection Time: 03/05/15 12:18 PM  Result Value Ref Range   Glucose-Capillary 197 (H) 70 - 99 mg/dL   Comment 1 Notify RN    Comment 2 Document in Chart   Glucose, capillary     Status: Abnormal   Collection Time: 03/05/15  4:51 PM  Result Value Ref Range   Glucose-Capillary 223 (H) 70 - 99 mg/dL  Glucose, capillary     Status: Abnormal   Collection Time: 03/05/15  9:30 PM   Result Value Ref Range   Glucose-Capillary 249 (H) 70 - 99 mg/dL  Glucose, capillary     Status: Abnormal   Collection Time: 03/06/15  4:01 AM  Result Value Ref Range   Glucose-Capillary 142 (H) 70 - 99 mg/dL  CBC     Status: Abnormal   Collection Time: 03/06/15  5:30 AM  Result Value Ref Range   WBC 4.4 4.0 - 10.5 K/uL   RBC 4.25 4.22 - 5.81 MIL/uL   Hemoglobin 11.7 (L) 13.0 - 17.0 g/dL   HCT 35.0 (L) 39.0 - 52.0 %   MCV 82.4 78.0 - 100.0 fL   MCH 27.5 26.0 - 34.0 pg   MCHC 33.4 30.0 - 36.0 g/dL   RDW 13.6 11.5 - 15.5 %   Platelets 201 150 - 400 K/uL  Comprehensive metabolic panel     Status: Abnormal   Collection Time: 03/06/15  5:30 AM  Result Value Ref Range   Sodium 137 135 - 145 mmol/L   Potassium 3.5 3.5 - 5.1 mmol/L   Chloride 103 96 - 112 mmol/L   CO2 26 19 - 32 mmol/L   Glucose, Bld 162 (H) 70 - 99 mg/dL   BUN 16 6 - 23 mg/dL   Creatinine, Ser 1.03 0.50 -  1.35 mg/dL   Calcium 8.3 (L) 8.4 - 10.5 mg/dL   Total Protein 6.3 6.0 - 8.3 g/dL   Albumin 3.4 (L) 3.5 - 5.2 g/dL   AST 54 (H) 0 - 37 U/L   ALT 108 (H) 0 - 53 U/L   Alkaline Phosphatase 92 39 - 117 U/L   Total Bilirubin 0.9 0.3 - 1.2 mg/dL   GFR calc non Af Amer 85 (L) >90 mL/min   GFR calc Af Amer >90 >90 mL/min    Comment: (NOTE) The eGFR has been calculated using the CKD EPI equation. This calculation has not been validated in all clinical situations. eGFR's persistently <90 mL/min signify possible Chronic Kidney Disease.    Anion gap 8 5 - 15  Glucose, capillary     Status: Abnormal   Collection Time: 03/06/15  8:06 AM  Result Value Ref Range   Glucose-Capillary 175 (H) 70 - 99 mg/dL  Wound culture     Status: None (Preliminary result)   Collection Time: 03/06/15 10:00 AM  Result Value Ref Range   Specimen Description ABDOMEN WOUND    Special Requests Normal    Gram Stain      NO WBC SEEN NO SQUAMOUS EPITHELIAL CELLS SEEN NO ORGANISMS SEEN Performed at [facility]    Culture PENDING     Report Status PENDING   Glucose, capillary     Status: Abnormal   Collection Time: 03/06/15 12:08 PM  Result Value Ref Range   Glucose-Capillary 182 (H) 70 - 99 mg/dL  Glucose, capillary     Status: Abnormal   Collection Time: 03/06/15  5:04 PM  Result Value Ref Range   Glucose-Capillary 151 (H) 70 - 99 mg/dL  Glucose, capillary     Status: Abnormal   Collection Time: 03/06/15  9:56 PM  Result Value Ref Range   Glucose-Capillary 194 (H) 70 - 99 mg/dL  CBC     Status: Abnormal   Collection Time: 03/07/15  5:54 AM  Result Value Ref Range   WBC 5.7 4.0 - 10.5 K/uL   RBC 4.50 4.22 - 5.81 MIL/uL   Hemoglobin 12.4 (L) 13.0 - 17.0 g/dL   HCT 37.3 (L) 39.0 - 52.0 %   MCV 82.9 78.0 - 100.0 fL   MCH 27.6 26.0 - 34.0 pg   MCHC 33.2 30.0 - 36.0 g/dL   RDW 13.7 11.5 - 15.5 %   Platelets 228 150 - 400 K/uL  Comprehensive metabolic panel     Status: Abnormal   Collection Time: 03/07/15  5:54 AM  Result Value Ref Range   Sodium 137 135 - 145 mmol/L   Potassium 3.7 3.5 - 5.1 mmol/L   Chloride 104 96 - 112 mmol/L   CO2 25 19 - 32 mmol/L   Glucose, Bld 180 (H) 70 - 99 mg/dL   BUN 9 6 - 23 mg/dL   Creatinine, Ser 0.92 0.50 - 1.35 mg/dL   Calcium 8.5 8.4 - 10.5 mg/dL   Total Protein 6.7 6.0 - 8.3 g/dL   Albumin 3.6 3.5 - 5.2 g/dL   AST 155 (H) 0 - 37 U/L   ALT 188 (H) 0 - 53 U/L   Alkaline Phosphatase 100 39 - 117 U/L   Total Bilirubin 0.7 0.3 - 1.2 mg/dL   GFR calc non Af Amer >90 >90 mL/min   GFR calc Af Amer >90 >90 mL/min    Comment: (NOTE) The eGFR has been calculated using the CKD EPI equation. This calculation has  not been validated in all clinical situations. eGFR's persistently <90 mL/min signify possible Chronic Kidney Disease.    Anion gap 8 5 - 15  Glucose, capillary     Status: Abnormal   Collection Time: 03/07/15  7:36 AM  Result Value Ref Range   Glucose-Capillary 177 (H) 70 - 99 mg/dL   Comment 1 Notify RN     Imaging / Studies: No results  found.  Medications / Allergies:  Scheduled Meds: . acetaminophen  1,000 mg Oral TID  . atorvastatin  40 mg Oral q1800  . aztreonam  2 g Intravenous 3 times per day  . bisacodyl  10 mg Rectal Daily  . enoxaparin (LOVENOX) injection  60 mg Subcutaneous Q24H  . insulin aspart  0-20 Units Subcutaneous TID WC  . insulin aspart  0-5 Units Subcutaneous QHS  . insulin glargine  5 Units Subcutaneous QHS  . lip balm  1 application Topical BID  . lisinopril  10 mg Oral Daily  . metFORMIN  1,000 mg Oral BID WC  . pantoprazole  80 mg Oral BID  . sodium chloride  3 mL Intravenous Q12H   Continuous Infusions: . sodium chloride 0.9 % 1,000 mL with potassium chloride 20 mEq infusion Stopped (03/01/15 1535)   PRN Meds:.sodium chloride, alum & mag hydroxide-simeth, diphenhydrAMINE, fentaNYL, hydrALAZINE, ketorolac, LORazepam, magic mouthwash, menthol-cetylpyridinium, methocarbamol (ROBAXIN)  IV, metoCLOPramide (REGLAN) injection, ondansetron (ZOFRAN) IV **OR** ondansetron (ZOFRAN) IV, ondansetron **OR** [DISCONTINUED] ondansetron (ZOFRAN) IV, oxyCODONE, phenol, promethazine, sodium chloride  Antibiotics: Anti-infectives    Start     Dose/Rate Route Frequency Ordered Stop   03/03/15 1400  aztreonam (AZACTAM) 2 g in dextrose 5 % 50 mL IVPB     2 g 100 mL/hr over 30 Minutes Intravenous 3 times per day 03/03/15 1006     03/01/15 0600  aztreonam (AZACTAM) injection 2 g  Status:  Discontinued     2 g Intramuscular 3 times per day 03/01/15 0203 03/01/15 0207   03/01/15 0500  aztreonam (AZACTAM) 2 g in dextrose 5 % 50 mL IVPB  Status:  Discontinued     2 g 100 mL/hr over 30 Minutes Intravenous 3 times per day 03/01/15 0208 03/03/15 0823   03/01/15 0300  metroNIDAZOLE (FLAGYL) IVPB 500 mg  Status:  Discontinued     500 mg 100 mL/hr over 60 Minutes Intravenous 3 times per day 03/01/15 0203 03/03/15 0853        Assessment/Plan POD#6 laparoscopic cholecystectomy And liver biopsy---Dr. Johney Maine S/p  evacuation of  Hematoma at bedside -stable for DC from surgical standpoint with 5 days of po antibiotics -home health for daily wet to dry dressing changes -pain medication -f/u with Dr. Johney Maine -LFTs reviewed, normal bili, could be due to tylenol, stop.  Repeat LFTs in 1 week.  Erby Pian, [facility] Pager [phone](7A-4:30P)   03/07/2015 8:14 AM

## 2015-03-07 NOTE — Progress Notes (Signed)
Discharge instructions given to pt with all questions answered. Teaching accepted.  

## 2015-03-09 ENCOUNTER — Other Ambulatory Visit: Payer: Self-pay | Admitting: General Surgery

## 2015-03-09 ENCOUNTER — Other Ambulatory Visit: Payer: Self-pay | Admitting: Surgery

## 2015-03-09 DIAGNOSIS — K812 Acute cholecystitis with chronic cholecystitis: Secondary | ICD-10-CM

## 2015-03-09 LAB — WOUND CULTURE
Gram Stain: NONE SEEN
Special Requests: NORMAL

## 2015-03-09 MED ORDER — LINEZOLID 600 MG PO TABS: 600.0000 mg | ORAL_TABLET | Freq: Two times a day (BID) | ORAL | Status: DC

## 2015-03-09 NOTE — Progress Notes (Signed)
Culture--enterococcus species sensitive to ampicillin and Vanc, however, the patient has a severe allergy to PCN and has been discharged. I spoke with pharmacy and Dr. Orvan Falconerampbell with ID who recommended zyvox 300mg  PO BID x7-10 days. I then spoke with the patient who reports that the erythema has significantly improved, but still has pain when he gets up, which I told him was normal.  He has home health coming out for dressing changes, but mentioned they needed to discussing his dressing changes.  I will try and contact advanced home.    Jeannett Dekoning, ANP-BC

## 2015-03-14 ENCOUNTER — Other Ambulatory Visit: Payer: Self-pay | Admitting: Surgery

## 2016-07-30 ENCOUNTER — Emergency Department (HOSPITAL_BASED_OUTPATIENT_CLINIC_OR_DEPARTMENT_OTHER): Payer: Worker's Compensation

## 2016-07-30 ENCOUNTER — Encounter (HOSPITAL_BASED_OUTPATIENT_CLINIC_OR_DEPARTMENT_OTHER): Payer: Self-pay

## 2016-07-30 ENCOUNTER — Emergency Department (HOSPITAL_BASED_OUTPATIENT_CLINIC_OR_DEPARTMENT_OTHER)
Admission: EM | Admit: 2016-07-30 | Discharge: 2016-07-31 | Disposition: A | Discharge status: Home / Self Care | Payer: Worker's Compensation | Attending: Emergency Medicine | Admitting: Emergency Medicine

## 2016-07-30 DIAGNOSIS — Z87891 Personal history of nicotine dependence: Secondary | ICD-10-CM | POA: Insufficient documentation

## 2016-07-30 DIAGNOSIS — S8392XA Sprain of unspecified site of left knee, initial encounter: Secondary | ICD-10-CM | POA: Diagnosis not present

## 2016-07-30 DIAGNOSIS — Z7984 Long term (current) use of oral hypoglycemic drugs: Secondary | ICD-10-CM | POA: Insufficient documentation

## 2016-07-30 DIAGNOSIS — Y9261 Building [any] under construction as the place of occurrence of the external cause: Secondary | ICD-10-CM | POA: Insufficient documentation

## 2016-07-30 DIAGNOSIS — I10 Essential (primary) hypertension: Secondary | ICD-10-CM | POA: Insufficient documentation

## 2016-07-30 DIAGNOSIS — X501XXA Overexertion from prolonged static or awkward postures, initial encounter: Secondary | ICD-10-CM | POA: Diagnosis not present

## 2016-07-30 DIAGNOSIS — E119 Type 2 diabetes mellitus without complications: Secondary | ICD-10-CM | POA: Insufficient documentation

## 2016-07-30 DIAGNOSIS — Y939 Activity, unspecified: Secondary | ICD-10-CM | POA: Insufficient documentation

## 2016-07-30 DIAGNOSIS — Z79899 Other long term (current) drug therapy: Secondary | ICD-10-CM | POA: Diagnosis not present

## 2016-07-30 DIAGNOSIS — Z794 Long term (current) use of insulin: Secondary | ICD-10-CM | POA: Insufficient documentation

## 2016-07-30 DIAGNOSIS — S8992XA Unspecified injury of left lower leg, initial encounter: Secondary | ICD-10-CM | POA: Diagnosis present

## 2016-07-30 DIAGNOSIS — Y990 Civilian activity done for income or pay: Secondary | ICD-10-CM | POA: Diagnosis not present

## 2016-07-30 MED ORDER — HYDROCODONE-ACETAMINOPHEN 5-325 MG PO TABS: 1.0000 | ORAL_TABLET | Freq: Four times a day (QID) | ORAL | 0 refills | Status: DC | PRN

## 2016-07-30 MED ORDER — NAPROXEN 250 MG PO TABS
500.0000 mg | ORAL_TABLET | Freq: Once | ORAL | Status: AC
  Administered 2016-07-31: 500 mg via ORAL
  Filled 2016-07-30: qty 2

## 2016-07-30 NOTE — ED Triage Notes (Addendum)
C/o left knee pain/swelling after twisting motion at work-also c/o sore throat, dry throat, runny nose-all after he was pulling DOA from under building approx 8pm-NAD

## 2016-07-30 NOTE — ED Provider Notes (Signed)
[facility]   CSN: [id number] Arrival date & time: 07/30/16  2210  By signing my name below, I, Freida Busmaniana Omoyeni, attest that this documentation has been prepared under the direction and in the presence of Paula LibraJohn Celeste Tavenner, MD . Electronically Signed: Freida Busmaniana Omoyeni, Scribe. 07/30/2016. 11:56 PM  History   Chief Complaint Chief Complaint  Patient presents with  . Knee Injury    The history is provided by the patient. No language interpreter was used.   HPI Comments:  Joseph [name] is a 50 y.o. male who presents to the Emergency Department complaining of 3/10 medial left knee pain s/p injury today ~2015 this evening. Pt states he mis-stepped while crawling out from under a building at work and felt the left knee pop. He reports associated knee swelling. Pt is able to ambulate but notes increased pain when he tries to turn the knee. Pt also notes throat irritation which she attributes to inhaling dust or other irritant while under the building.  Past Medical History:  Diagnosis Date  . Diabetes mellitus   . GERD (gastroesophageal reflux disease)   . Hypertension   . Renal disorder    Kidney Stones    Patient Active Problem List   Diagnosis Date Noted  . Acute cholecystitis s/p lap chole 03/01/2015 03/01/2015  . Type 2 diabetes mellitus with hyperglycemia (HCC) 03/01/2015  . Essential hypertension 03/01/2015  . Hyperlipidemia 03/01/2015    Past Surgical History:  Procedure Laterality Date  . CHOLECYSTECTOMY    . LAPAROSCOPIC CHOLECYSTECTOMY SINGLE PORT N/A 03/01/2015   Procedure: LAPAROSCOPIC CHOLECYSTECTOMY SINGLE SITE AND CORE LIVER BIOPSY ;  Surgeon: Karie SodaSteven Gross, MD;  Location: [facility];  Service: General;  Laterality: N/A;    Home Medications    Prior to Admission medications   Medication Sig Start Date End Date Taking? Authorizing Provider  Canagliflozin (INVOKANA PO) Take by mouth.   Yes Historical Provider, MD  Insulin Detemir (LEVEMIR [place]) Inject  into the skin.   Yes Historical Provider, MD  losartan (COZAAR) 50 MG tablet Take 50 mg by mouth daily.   Yes Historical Provider, MD  glipiZIDE (GLUCOTROL) 5 MG tablet Take 5 mg by mouth 2 (two) times daily before a meal.    Historical Provider, MD  HYDROcodone-acetaminophen (NORCO) 5-325 MG tablet Take 1-2 tablets by mouth every 6 (six) hours as needed (for pain). 07/30/16   Sofie Schendel, MD  metFORMIN (GLUCOPHAGE) 500 MG tablet Take 1,000 mg by mouth 2 (two) times daily with a meal.    Historical Provider, MD  Multiple Vitamins-Minerals (DIABETES HEALTH FORMULA PO) Take 1 tablet by mouth daily.    Historical Provider, MD  omeprazole (PRILOSEC) 20 MG capsule Take 20 mg by mouth daily.    Historical Provider, MD  ondansetron (ZOFRAN) 4 MG tablet Take 1 tablet (4 mg total) by mouth every 6 (six) hours as needed for nausea. 03/07/15   Jeralyn BennettEzequiel Zamora, MD  Specialty Vitamins Products (VITA-RX DIABETIC VITAMIN PO) Take 5 tablets by mouth daily.    Historical Provider, MD    Family History No family history on file.  Social History Social History  Substance Use Topics  . Smoking status: Former Games developermoker  . Smokeless tobacco: Never Used  . Alcohol use Yes     Comment: social     Allergies   Penicillins; Levaquin [levofloxacin]; Lisinopril; and Erythromycin   Review of Systems Review of Systems  10 systems reviewed and all are negative for acute change except as noted in  the HPI.  Physical Exam Updated Vital Signs BP 142/95 (BP Location: Right Arm)   Pulse 94   Temp 99.1 F (37.3 C)   Resp 20   Ht 6\' 3"  (1.905 m)   Wt (!) 312 lb (141.5 kg)   SpO2 96%   BMI 39.00 kg/m   Physical Exam  General: Well-developed, well-nourished male in no acute distress; appearance consistent with age of record HENT: normocephalic; atraumatic; pharynx appears normal Eyes: pupils equal, round and reactive to light; extraocular muscles intact Neck: supple Heart: regular rate and rhythm Lungs: clear  to auscultation bilaterally Abdomen: soft; nondistended; nontender; bowel sounds present Extremities: No deformity; full range of motion except left knee limited by pain; pulses normal; left knee stable with tenderness over the medial collateral ligament Neurologic: Awake, alert and oriented; motor function intact in all extremities and symmetric; no facial droop Skin: Warm and dry Psychiatric: Normal mood and affect  ED Treatments / Results  Nursing notes and vitals signs, including pulse oximetry, reviewed.  Summary of this visit's results, reviewed by myself:  Labs:  No results found for this or any previous visit (from the past 24 hour(s)).  Imaging Studies: Dg Knee Complete 4 Views Left  Result Date: 07/30/2016 CLINICAL DATA:  Twisted left knee 2.5 hours ago. Medial pain. Initial encounter. EXAM: LEFT KNEE - COMPLETE 4+ VIEW COMPARISON:  None. FINDINGS: No evidence of fracture, dislocation, or definite joint effusion. 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 IMPRESSION: 1. No acute finding. 2. Lateral compartment osteoarthritis. Electronically Signed   By: Marnee SpringJonathon  Watts M.D.   On: 07/30/2016 23:04    Procedures    Final Clinical Impressions(s) / ED Diagnoses   Final diagnoses:  Sprain of left knee, initial encounter   I personally performed the services described in this documentation, which was scribed in my presence. The recorded information has been reviewed and is accurate.    Paula LibraJohn Emilliano Dilworth, MD 07/31/16 [phone]

## 2017-12-04 ENCOUNTER — Emergency Department (HOSPITAL_COMMUNITY): Payer: Worker's Compensation

## 2017-12-04 ENCOUNTER — Encounter (HOSPITAL_COMMUNITY): Payer: Self-pay | Admitting: Emergency Medicine

## 2017-12-04 ENCOUNTER — Emergency Department (HOSPITAL_COMMUNITY)
Admission: EM | Admit: 2017-12-04 | Discharge: 2017-12-04 | Disposition: A | Discharge status: Home / Self Care | Payer: Worker's Compensation | Attending: Emergency Medicine | Admitting: Emergency Medicine

## 2017-12-04 DIAGNOSIS — X501XXA Overexertion from prolonged static or awkward postures, initial encounter: Secondary | ICD-10-CM | POA: Diagnosis not present

## 2017-12-04 DIAGNOSIS — Y929 Unspecified place or not applicable: Secondary | ICD-10-CM | POA: Insufficient documentation

## 2017-12-04 DIAGNOSIS — Y939 Activity, unspecified: Secondary | ICD-10-CM | POA: Diagnosis not present

## 2017-12-04 DIAGNOSIS — Y999 Unspecified external cause status: Secondary | ICD-10-CM | POA: Insufficient documentation

## 2017-12-04 DIAGNOSIS — S83402A Sprain of unspecified collateral ligament of left knee, initial encounter: Secondary | ICD-10-CM

## 2017-12-04 DIAGNOSIS — I10 Essential (primary) hypertension: Secondary | ICD-10-CM | POA: Diagnosis not present

## 2017-12-04 DIAGNOSIS — Z87891 Personal history of nicotine dependence: Secondary | ICD-10-CM | POA: Insufficient documentation

## 2017-12-04 DIAGNOSIS — Z794 Long term (current) use of insulin: Secondary | ICD-10-CM | POA: Diagnosis not present

## 2017-12-04 DIAGNOSIS — S8992XA Unspecified injury of left lower leg, initial encounter: Secondary | ICD-10-CM | POA: Diagnosis present

## 2017-12-04 DIAGNOSIS — E1165 Type 2 diabetes mellitus with hyperglycemia: Secondary | ICD-10-CM | POA: Insufficient documentation

## 2017-12-04 MED ORDER — CYCLOBENZAPRINE HCL 10 MG PO TABS: 10.0000 mg | ORAL_TABLET | Freq: Two times a day (BID) | ORAL | 0 refills | Status: DC | PRN

## 2017-12-04 MED ORDER — IBUPROFEN 800 MG PO TABS: 800.0000 mg | ORAL_TABLET | Freq: Three times a day (TID) | ORAL | 0 refills | Status: DC | PRN

## 2017-12-04 MED ORDER — IBUPROFEN 800 MG PO TABS
800.0000 mg | ORAL_TABLET | Freq: Once | ORAL | Status: AC
  Administered 2017-12-04: 800 mg via ORAL
  Filled 2017-12-04: qty 1

## 2017-12-04 NOTE — ED Provider Notes (Signed)
[facility]-EMERGENCY DEPT Provider Note   CSN: [id number] Arrival date & time: 12/04/17  1403     History   Chief Complaint Chief Complaint  Patient presents with  . Knee Pain    HPI Joseph [name] is a 51 y.o. male.  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  Earlier today patient was transferring another patient from the stretcher to the bed when his left foot got caught and twisted his left knee. He felt a pop follows with immediate pain to the medial aspect of the knee.  Pain is non radiating but pt unable to bear weight on his knee.  No hip or ankle pain.  Pain is 8/10, sharp, throbbing.  No specific treatment tried. Report prior knee injury requiring surgical repair to both knees by Dr. Rennis ChrisSupple.  No numbness.   Past Medical History:  Diagnosis Date  . Diabetes mellitus   . GERD (gastroesophageal reflux disease)   . Hypertension   . Renal disorder    Kidney Stones    Patient Active Problem List   Diagnosis Date Noted  . Acute cholecystitis s/p lap chole 03/01/2015 03/01/2015  . Type 2 diabetes mellitus with hyperglycemia (HCC) 03/01/2015  . Essential hypertension 03/01/2015  . Hyperlipidemia 03/01/2015    Past Surgical History:  Procedure Laterality Date  . CHOLECYSTECTOMY    . LAPAROSCOPIC CHOLECYSTECTOMY SINGLE PORT N/A 03/01/2015   Procedure: LAPAROSCOPIC CHOLECYSTECTOMY SINGLE SITE AND CORE LIVER BIOPSY ;  Surgeon: Karie SodaSteven Gross, MD;  Location: [facility];  Service: General;  Laterality: N/A;       Home Medications    Prior to Admission medications   Medication Sig Start Date End Date Taking? Authorizing Provider  Canagliflozin (INVOKANA PO) Take by mouth.    [provider]  glipiZIDE (GLUCOTROL) 5 MG tablet Take 5 mg by mouth 2 (two) times daily before a meal.    [provider]  HYDROcodone-acetaminophen (NORCO) 5-325 MG tablet Take 1-2 tablets by mouth every 6 (six)  hours as needed (for pain). 07/30/16   Molpus, John, MD  Insulin Detemir (LEVEMIR [place]) Inject into the skin.    [provider]  losartan (COZAAR) 50 MG tablet Take 50 mg by mouth daily.    [provider]  metFORMIN (GLUCOPHAGE) 500 MG tablet Take 1,000 mg by mouth 2 (two) times daily with a meal.    [provider]  Multiple Vitamins-Minerals (DIABETES HEALTH FORMULA PO) Take 1 tablet by mouth daily.    [provider]  omeprazole (PRILOSEC) 20 MG capsule Take 20 mg by mouth daily.    [provider]  ondansetron (ZOFRAN) 4 MG tablet Take 1 tablet (4 mg total) by mouth every 6 (six) hours as needed for nausea. 03/07/15   Jeralyn BennettZamora, Ezequiel, MD  Specialty Vitamins Products (VITA-RX DIABETIC VITAMIN PO) Take 5 tablets by mouth daily.    [provider]    Family History No family history on file.  Social History Social History   Tobacco Use  . Smoking status: Former Games developermoker  . Smokeless tobacco: Never Used  Substance Use Topics  . Alcohol use: Yes    Comment: social  . Drug use: No     Allergies   Penicillins; Levaquin [levofloxacin]; Lisinopril; and Erythromycin   Review of Systems Review of Systems  Constitutional: Negative for fever.  Musculoskeletal: Positive for arthralgias and joint swelling.  Neurological: Negative for numbness.     Physical  Exam Updated Vital Signs BP (!) 163/100 (BP Location: Left Arm)   Pulse 80   Temp 98.9 F (37.2 C) (Oral)   Resp 18   SpO2 97%   Physical Exam  Constitutional: He appears well-developed and well-nourished. No distress.  Obese male sitting in chair in no acute discomfort.  HENT:  Head: Atraumatic.  Eyes: Conjunctivae are normal.  Neck: Neck supple.  Abdominal: Soft. There is no tenderness.  Musculoskeletal: He exhibits tenderness (Left knee: Point tenderness to medial joint line with mild swelling noted.  Normal knee flexion and extension.  No joint laxity.  Pain with  valgus maneuver.  No deformity.).  Neurological: He is alert.  Skin: No rash noted.  Psychiatric: He has a normal mood and affect.  Nursing note and vitals reviewed.    ED Treatments / Results  Labs (all labs ordered are listed, but only abnormal results are displayed) Labs Reviewed - No data to display  EKG  EKG Interpretation None       Radiology Dg Knee Complete 4 Views Left  Result Date: 12/04/2017 CLINICAL DATA:  Injury EXAM: LEFT KNEE - COMPLETE 4+ VIEW COMPARISON:  07/30/2016 FINDINGS: No fracture or malalignment. Mild degenerative change involving the medial and lateral compartments. Small joint effusion. IMPRESSION: 1. No acute osseous abnormality 2. Small joint effusion Electronically Signed   By: Jasmine PangKim  Fujinaga M.D.   On: 12/04/2017 15:01    Procedures Procedures (including critical care time)  Medications Ordered in ED Medications  ibuprofen (ADVIL,MOTRIN) tablet 800 mg (not administered)     Initial Impression / Assessment and Plan / ED Course  I have reviewed the triage vital signs and the nursing notes.  Pertinent labs & imaging results that were available during my care of the patient were reviewed by me and considered in my medical decision making (see chart for details).     BP (!) 163/100 (BP Location: Left Arm)   Pulse 80   Temp 98.9 F (37.2 C) (Oral)   Resp 18   SpO2 97%    Final Clinical Impressions(s) / ED Diagnoses   Final diagnoses:  Sprain of collateral ligament of left knee, initial encounter    ED Discharge Orders        Ordered    ibuprofen (ADVIL,MOTRIN) 800 MG tablet  Every 8 hours PRN     12/04/17 1516    cyclobenzaprine (FLEXERIL) 10 MG tablet  2 times daily PRN     12/04/17 1516     3:21 PM Pt sprain is L knee. Pain to medial joint line and with valvus maneuver.  Suspect medial collateral ligament injury.  Will place in knee immobilizer, crutches, RICE therapy and outpt f/u with orthopedist Dr. Rennis ChrisSupple.    Fayrene Helperran,  Peaches [place]beke, PA-C 12/04/17 1522    Cathren LaineSteinl, Kevin, MD 12/04/17 [phone]

## 2017-12-04 NOTE — ED Triage Notes (Signed)
Patient was transferring patient and left foot got caught and rest of body transferred. C/o sharp stabbing pain in left knee.

## 2018-04-01 IMAGING — CR DG KNEE COMPLETE 4+V*L*
4 series · 4 of 4 positions shown · non-contrast
Comparison: 07/30/2016

CLINICAL DATA: Injury

EXAM:
LEFT KNEE - COMPLETE 4+ VIEW

[t knee ap left]
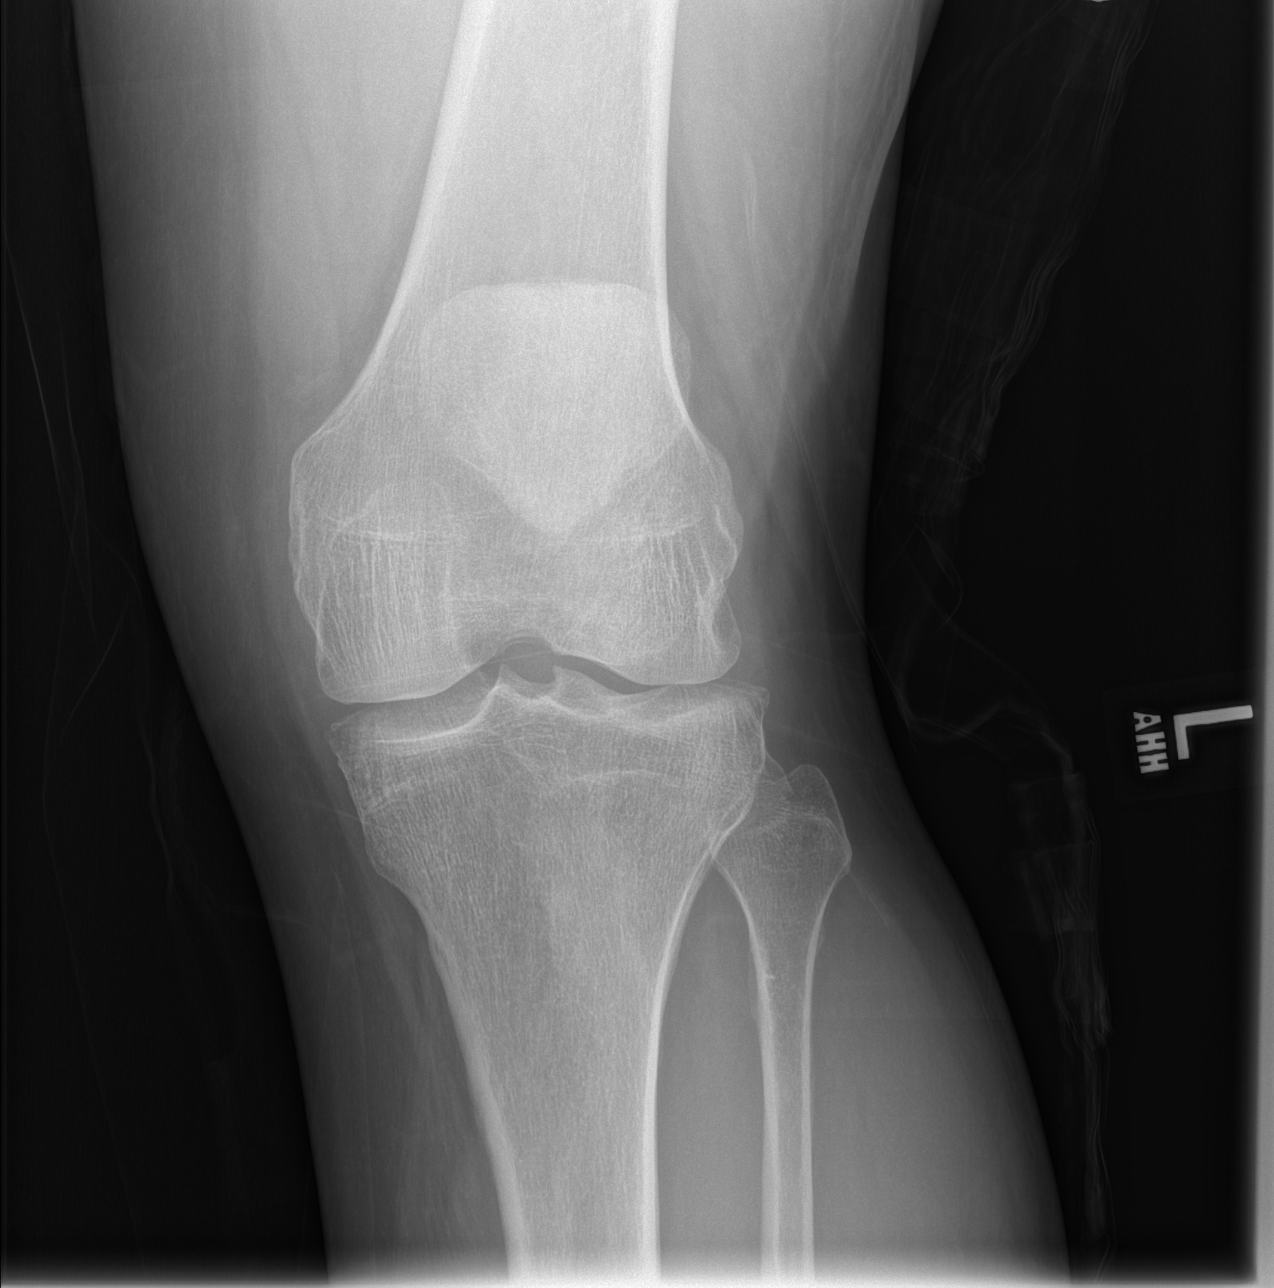

[x knee obl left]
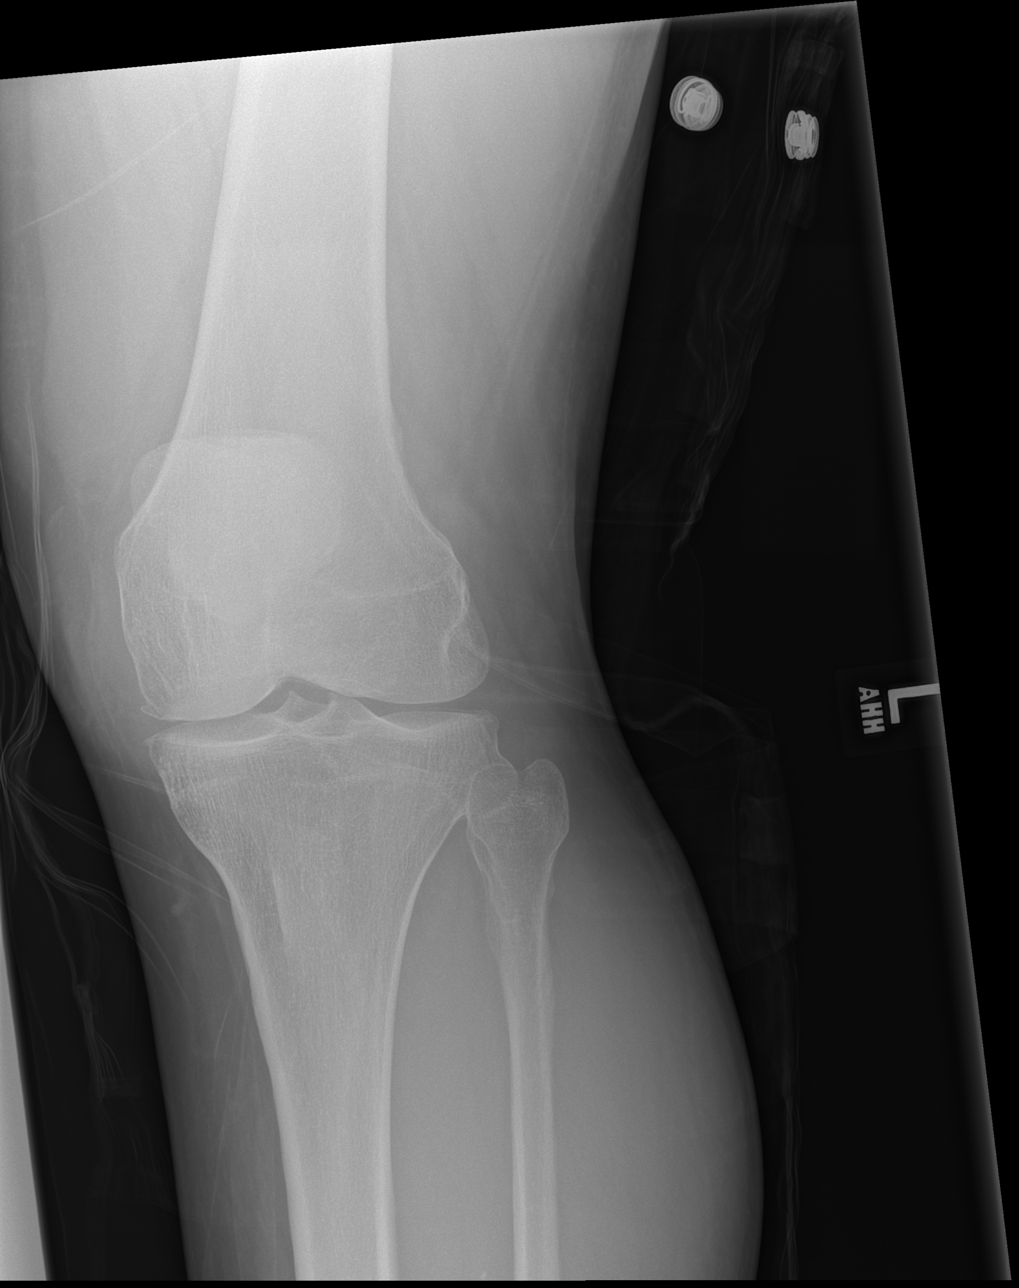

[t knee lat left (1 of 2)]
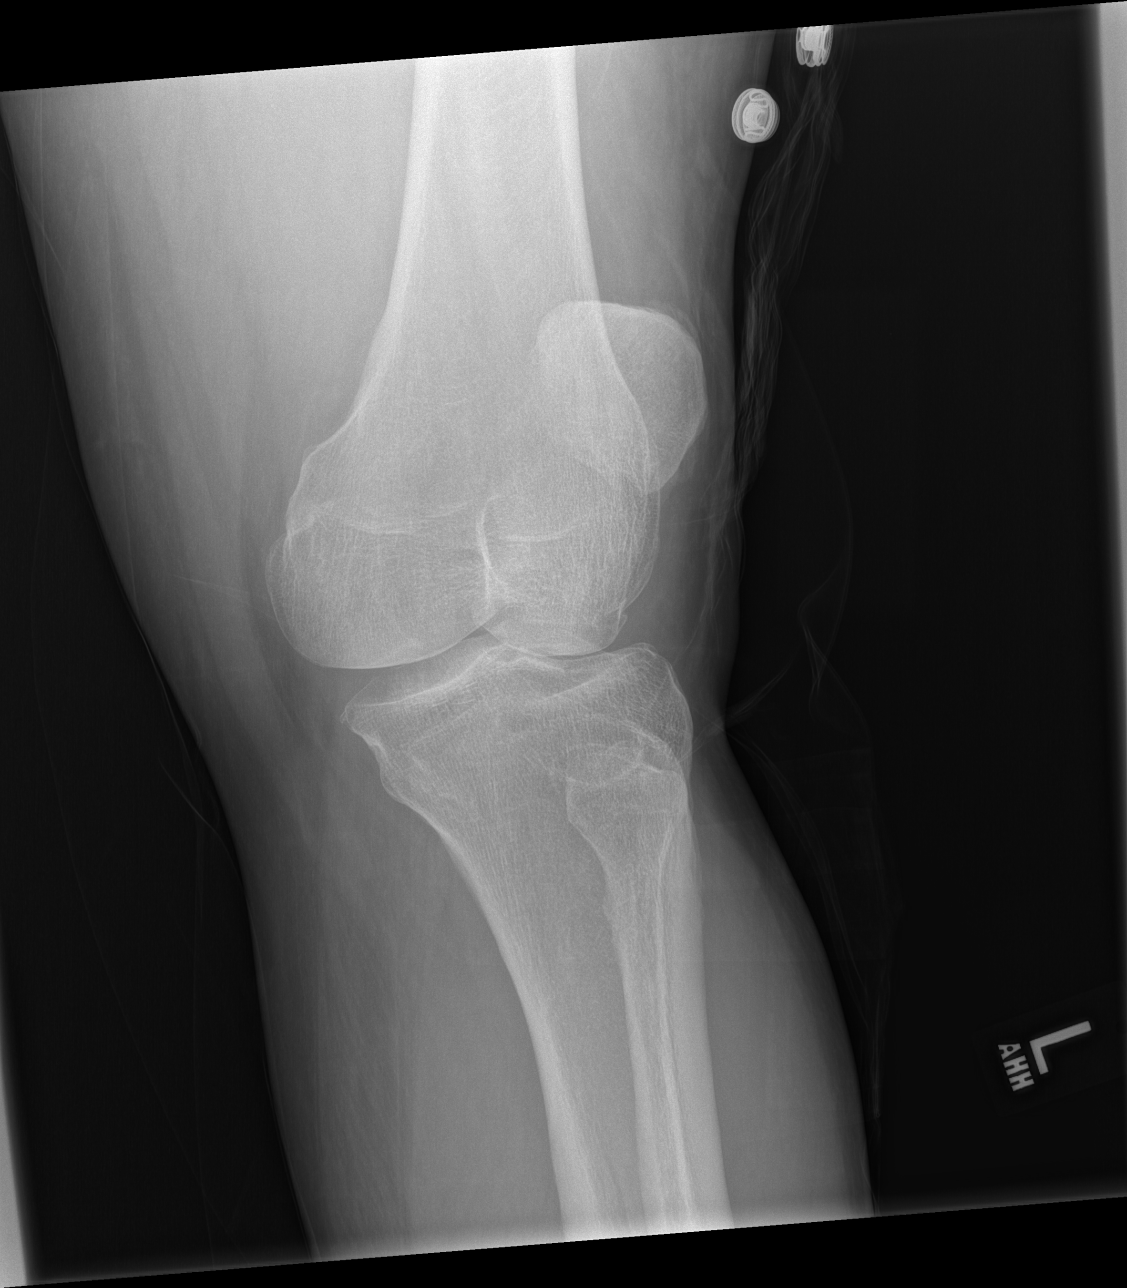

[t knee lat left (2 of 2)]
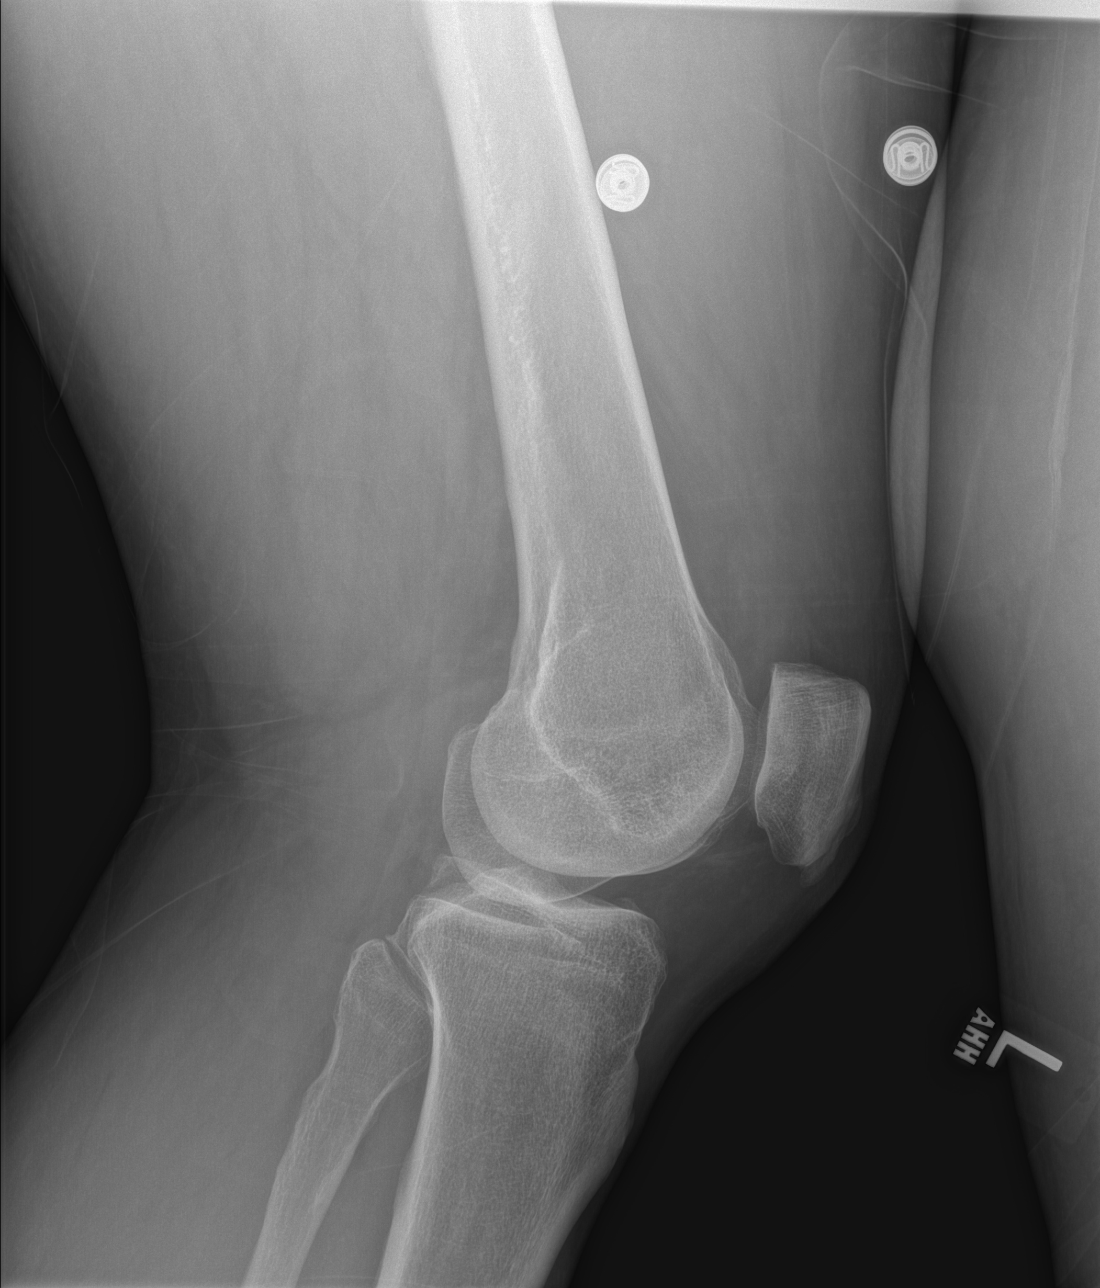

[4 of 4 positions shown; findings below may reference images not displayed]

FINDINGS: No fracture or malalignment. Mild degenerative change involving the
medial and lateral compartments. Small joint effusion.
IMPRESSION: 1. No acute osseous abnormality
2. Small joint effusion

## 2021-07-26 NOTE — Patient Instructions (Signed)
DUE TO COVID-19 ONLY ONE VISITOR IS ALLOWED TO COME WITH YOU AND STAY IN THE WAITING ROOM ONLY DURING PRE OP AND PROCEDURE.   **NO VISITORS ARE ALLOWED IN THE SHORT STAY AREA OR RECOVERY ROOM!!**        Your procedure is scheduled on: Friday, 08-16-21   Report to [facility] Main  Entrance   Report to Short Stay at 5:15 AM   [facility])   Call this number if you have problems the morning of surgery [phone]   Do not eat food :After Midnight.   May have liquids until 4:30 AM day of surgery  CLEAR LIQUID DIET  Foods Allowed                                                                     Foods Excluded  Water, Black Coffee and tea, regular and decaf             liquids that you cannot  Plain Jell-O in any flavor  (No red)                                   see through such as: Fruit ices (not with fruit pulp)                                      milk, soups, orange juice              Iced Popsicles (No red)                                      All solid food                                   Apple juices Sports drinks like Gatorade (No red) Lightly seasoned clear broth or consume(fat free) Sugar, honey syrup     Complete one G2 drink the morning of surgery at  4:30 AM  the day of surgery.       The day of surgery:  Drink ONE (1)  G2 the morning of surgery. Drink in one sitting. Do not sip.  This drink was given to you during your hospital  pre-op appointment visit. Nothing else to drink after completing the G2.          If you have questions, please contact your surgeon's office.     Oral Hygiene is also important to reduce your risk of infection.                                    Remember - BRUSH YOUR TEETH THE MORNING OF SURGERY WITH YOUR REGULAR TOOTHPASTE   Do NOT smoke after Midnight   Take these medicines the morning of surgery with A SIP OF WATER: Omeprazole.  Hydrocodone and Ondansetron if needed  How to Manage Your Diabetes Before and  After  Surgery  Why is it important to control my blood sugar before and after surgery? Improving blood sugar levels before and after surgery helps healing and can limit problems. A way of improving blood sugar control is eating a healthy diet by:  Eating less sugar and carbohydrates  Increasing activity/exercise  Talking with your doctor about reaching your blood sugar goals High blood sugars (greater than 180 mg/dL) can raise your risk of infections and slow your recovery, so you will need to focus on controlling your diabetes during the weeks before surgery. Make sure that the doctor who takes care of your diabetes knows about your planned surgery including the date and location.  How do I manage my blood sugar before surgery? Check your blood sugar at least 4 times a day, starting 2 days before surgery, to make sure that the level is not too high or low. Check your blood sugar the morning of your surgery when you wake up and every 2 hours until you get to the Short Stay unit. If your blood sugar is less than 70 mg/dL, you will need to treat for low blood sugar: Do not take insulin. Treat a low blood sugar (less than 70 mg/dL) with  cup of clear juice (cranberry or apple), 4 glucose tablets, OR glucose gel. Recheck blood sugar in 15 minutes after treatment (to make sure it is greater than 70 mg/dL). If your blood sugar is not greater than 70 mg/dL on recheck, call [phone] for further instructions. Report your blood sugar to the short stay nurse when you get to Short Stay.  If you are admitted to the hospital after surgery: Your blood sugar will be checked by the staff and you will probably be given insulin after surgery (instead of oral diabetes medicines) to make sure you have good blood sugar levels. The goal for blood sugar control after surgery is 80-180 mg/dL.   WHAT DO I DO ABOUT MY DIABETES MEDICATION?  Do not take oral diabetes medicines (pills) the morning of surgery.  THE DAY  BEFORE SURGERY:  Hold Invokana.               Take Glipizide as prescribed (no evening dose)               Take Metformin as prescribed    Take Insulin Detemir as prescribed (50% at bedtime)       THE MORNING OF SURGER:  Do not take Invokana, Glipizide or Metformin.                                               Take 50% of Insulin Detemir   Reviewed and Endorsed by [facility] Patient Education Committee, August 2015                               You may not have any metal on your body including  jewelry, and body piercing             Do not wear  lotions, powders, cologne, or deodorant              Men may shave face and neck.   Do not bring valuables to the hospital. [facility] IS NOT RESPONSIBLE   FOR VALUABLES.   Contacts, dentures or bridgework may  not be worn into surgery.     Patients discharged the day of surgery will not be allowed to drive home.  Special Instructions: Bring a copy of your healthcare power of attorney and living will documents         the day of surgery if you haven't scanned them in before.   Please read over the following fact sheets you were given: IF YOU HAVE QUESTIONS ABOUT YOUR PRE OP INSTRUCTIONS PLEASE CALL [phone]   [facility] - Preparing for Surgery Before surgery, you can play an important role.  Because skin is not sterile, your skin needs to be as free of germs as possible.  You can reduce the number of germs on your skin by washing with CHG (chlorahexidine gluconate) soap before surgery.  CHG is an antiseptic cleaner which kills germs and bonds with the skin to continue killing germs even after washing. Please DO NOT use if you have an allergy to CHG or antibacterial soaps.  If your skin becomes reddened/irritated stop using the CHG and inform your nurse when you arrive at Short Stay. Do not shave (including legs and underarms) for at least 48 hours prior to the first CHG shower.  You may shave your face/neck.  Please follow these  instructions carefully:  1.  Shower with CHG Soap the night before surgery and the  morning of surgery.  2.  If you choose to wash your hair, wash your hair first as usual with your normal  shampoo.  3.  After you shampoo, rinse your hair and body thoroughly to remove the shampoo.                             4.  Use CHG as you would any other liquid soap.  You can apply chg directly to the skin and wash.  Gently with a scrungie or clean washcloth.  5.  Apply the CHG Soap to your body ONLY FROM THE NECK DOWN.   Do   not use on face/ open                           Wound or open sores. Avoid contact with eyes, ears mouth and   genitals (private parts).                       Wash face,  Genitals (private parts) with your normal soap.             6.  Wash thoroughly, paying special attention to the area where your    surgery  will be performed.  7.  Thoroughly rinse your body with warm water from the neck down.  8.  DO NOT shower/wash with your normal soap after using and rinsing off the CHG Soap.                9.  Pat yourself dry with a clean towel.            10.  Wear clean pajamas.            11.  Place clean sheets on your bed the night of your first shower and do not  sleep with pets. Day of Surgery : Do not apply any lotions/deodorants the morning of surgery.  Please wear clean clothes to the hospital/surgery center.  FAILURE TO FOLLOW THESE INSTRUCTIONS MAY RESULT IN THE CANCELLATION  OF YOUR SURGERY  PATIENT SIGNATURE_________________________________  NURSE SIGNATURE__________________________________  ________________________________________________________________________

## 2021-07-26 NOTE — Progress Notes (Signed)
COVID swab appointment:  COVID Vaccine Completed: yes x2 Date COVID Vaccine completed: 04-30-20, 05-24-20 Has received booster: COVID vaccine manufacturer: Pfizer     Date of COVID positive in last 90 days:  PCP - Narda Rutherford, MD Cardiologist - Elwyn Reach, MD (note care everywhere)  Chest x-ray -  EKG -  Stress Test -  ECHO - 09-15-17 Care Everywhere Cardiac Cath -  Pacemaker/ICD device last checked: Spinal Cord Stimulator:  Sleep Study -  CPAP -   Fasting Blood Sugar -  Checks Blood Sugar _____ times a day  Blood Thinner Instructions: Aspirin Instructions: Last Dose:  Activity level:  Can go up a flight of stairs and perform activities of daily living without stopping and without symptoms of chest pain or shortness of breath.   Able to exercise without symptoms  Unable to go up a flight of stairs without symptoms of      Anesthesia review:  Saw cardiology 2018 for chest pain   Patient denies shortness of breath, fever, cough and chest pain at PAT appointment   Patient verbalized understanding of instructions that were given to them at the PAT appointment. Patient was also instructed that they will need to review over the PAT instructions again at home before surgery.

## 2021-07-26 NOTE — Progress Notes (Signed)
Please enter orders for PAT visit scheduled for 07-31-21. 

## 2021-07-31 ENCOUNTER — Encounter (HOSPITAL_COMMUNITY)
Admission: RE | Admit: 2021-07-31 | Discharge: 2021-07-31 | Disposition: A | Discharge status: Home / Self Care | Payer: 59 | Source: Ambulatory Visit | Attending: Internal Medicine | Admitting: Internal Medicine

## 2021-08-08 ENCOUNTER — Encounter (HOSPITAL_COMMUNITY): Payer: Self-pay | Admitting: Orthopedic Surgery

## 2021-08-08 ENCOUNTER — Other Ambulatory Visit: Payer: Self-pay

## 2021-08-08 NOTE — Progress Notes (Signed)
Spoke with pt not to take Jardiance on 08/12/2021.no bedtime dose of humalog. On 08/13/2021 no metformin  in am no humalog unless cbg is greater than 220 than take half of usual dose. Pt. Instructed to check blood sugar upon awakening DOS and every two hours until arrive at hospital . If blood sugar drops below 70 drink half glass of juice. Then recheck blood sugar in 15 minutes if still below 70 call [phone] ask for a nurse.

## 2021-08-13 ENCOUNTER — Encounter (HOSPITAL_COMMUNITY): Payer: Self-pay | Admitting: Orthopedic Surgery

## 2021-08-13 ENCOUNTER — Encounter (HOSPITAL_COMMUNITY)
Admission: RE | Disposition: A | Discharge status: Home / Self Care | Payer: Self-pay | Source: Home / Self Care | Attending: Orthopedic Surgery

## 2021-08-13 ENCOUNTER — Other Ambulatory Visit: Payer: Self-pay

## 2021-08-13 ENCOUNTER — Ambulatory Visit (HOSPITAL_COMMUNITY): Payer: No Typology Code available for payment source | Admitting: Anesthesiology

## 2021-08-13 ENCOUNTER — Ambulatory Visit (HOSPITAL_COMMUNITY)
Admission: RE | Admit: 2021-08-13 | Discharge: 2021-08-13 | Disposition: A | Discharge status: Home / Self Care | Payer: No Typology Code available for payment source | Attending: Orthopedic Surgery | Admitting: Orthopedic Surgery

## 2021-08-13 DIAGNOSIS — S83281A Other tear of lateral meniscus, current injury, right knee, initial encounter: Secondary | ICD-10-CM | POA: Diagnosis not present

## 2021-08-13 DIAGNOSIS — Z888 Allergy status to other drugs, medicaments and biological substances status: Secondary | ICD-10-CM | POA: Diagnosis not present

## 2021-08-13 DIAGNOSIS — Z794 Long term (current) use of insulin: Secondary | ICD-10-CM | POA: Insufficient documentation

## 2021-08-13 DIAGNOSIS — Z87891 Personal history of nicotine dependence: Secondary | ICD-10-CM | POA: Diagnosis not present

## 2021-08-13 DIAGNOSIS — S83231A Complex tear of medial meniscus, current injury, right knee, initial encounter: Secondary | ICD-10-CM | POA: Diagnosis not present

## 2021-08-13 DIAGNOSIS — Y990 Civilian activity done for income or pay: Secondary | ICD-10-CM | POA: Insufficient documentation

## 2021-08-13 DIAGNOSIS — Z881 Allergy status to other antibiotic agents status: Secondary | ICD-10-CM | POA: Insufficient documentation

## 2021-08-13 DIAGNOSIS — Z880 Allergy status to penicillin: Secondary | ICD-10-CM | POA: Insufficient documentation

## 2021-08-13 DIAGNOSIS — E119 Type 2 diabetes mellitus without complications: Secondary | ICD-10-CM | POA: Diagnosis not present

## 2021-08-13 DIAGNOSIS — Z7984 Long term (current) use of oral hypoglycemic drugs: Secondary | ICD-10-CM | POA: Insufficient documentation

## 2021-08-13 DIAGNOSIS — Z79899 Other long term (current) drug therapy: Secondary | ICD-10-CM | POA: Diagnosis not present

## 2021-08-13 DIAGNOSIS — Z7951 Long term (current) use of inhaled steroids: Secondary | ICD-10-CM | POA: Diagnosis not present

## 2021-08-13 DIAGNOSIS — M94261 Chondromalacia, right knee: Secondary | ICD-10-CM | POA: Insufficient documentation

## 2021-08-13 DIAGNOSIS — X58XXXA Exposure to other specified factors, initial encounter: Secondary | ICD-10-CM | POA: Diagnosis not present

## 2021-08-13 DIAGNOSIS — M659 Synovitis and tenosynovitis, unspecified: Secondary | ICD-10-CM | POA: Insufficient documentation

## 2021-08-13 DIAGNOSIS — S83241A Other tear of medial meniscus, current injury, right knee, initial encounter: Secondary | ICD-10-CM | POA: Diagnosis present

## 2021-08-13 DIAGNOSIS — Z791 Long term (current) use of non-steroidal anti-inflammatories (NSAID): Secondary | ICD-10-CM | POA: Insufficient documentation

## 2021-08-13 HISTORY — DX: Unspecified osteoarthritis, unspecified site: M19.90

## 2021-08-13 HISTORY — DX: Other complications of anesthesia, initial encounter: T88.59XA

## 2021-08-13 HISTORY — PX: KNEE ARTHROSCOPY WITH MEDIAL MENISECTOMY: SHX5651

## 2021-08-13 HISTORY — DX: Family history of other specified conditions: Z84.89

## 2021-08-13 LAB — BASIC METABOLIC PANEL
Anion gap: 11 (ref 5–15)
BUN: 21 mg/dL — ABNORMAL HIGH (ref 6–20)
CO2: 19 mmol/L — ABNORMAL LOW (ref 22–32)
Calcium: 9.2 mg/dL (ref 8.9–10.3)
Chloride: 105 mmol/L (ref 98–111)
Creatinine, Ser: 1.25 mg/dL — ABNORMAL HIGH (ref 0.61–1.24)
GFR, Estimated: >60 mL/min (ref >60)
Glucose, Bld: 143 mg/dL — ABNORMAL HIGH (ref 70–99)
Potassium: 4.1 mmol/L (ref 3.5–5.1)
Sodium: 135 mmol/L (ref 135–145)

## 2021-08-13 LAB — CBC
HCT: 46.5 % (ref 39.0–52.0)
Hemoglobin: 15.3 g/dL (ref 13.0–17.0)
MCH: 28.6 pg (ref 26.0–34.0)
MCHC: 32.9 g/dL (ref 30.0–36.0)
MCV: 86.9 fL (ref 80.0–100.0)
Platelets: 189 10*3/uL (ref 150–400)
RBC: 5.35 MIL/uL (ref 4.22–5.81)
RDW: 13.9 % (ref 11.5–15.5)
WBC: 7.2 10*3/uL (ref 4.0–10.5)
nRBC: 0 % (ref 0.0–0.2)

## 2021-08-13 LAB — GLUCOSE, CAPILLARY
Glucose-Capillary: 160 mg/dL — ABNORMAL HIGH (ref 70–99)
Glucose-Capillary: 98 mg/dL (ref 70–99)
Glucose-Capillary: 89 mg/dL (ref 70–99)

## 2021-08-13 SURGERY — ARTHROSCOPY, KNEE, WITH MEDIAL MENISCECTOMY
Anesthesia: General | Site: Knee | Laterality: Right

## 2021-08-13 MED ORDER — PHENYLEPHRINE HCL (PRESSORS) 10 MG/ML IV SOLN
INTRAVENOUS | Status: DC | PRN
  Administered 2021-08-13: 100 ug via INTRAVENOUS

## 2021-08-13 MED ORDER — CHLORHEXIDINE GLUCONATE 0.12 % MT SOLN
15.0000 mL | Freq: Once | OROMUCOSAL | Status: AC
  Administered 2021-08-13: 15 mL via OROMUCOSAL
  Filled 2021-08-13: qty 15

## 2021-08-13 MED ORDER — ORAL CARE MOUTH RINSE: 15.0000 mL | Freq: Once | OROMUCOSAL | Status: AC

## 2021-08-13 MED ORDER — EPINEPHRINE PF 1 MG/ML IJ SOLN
INTRAMUSCULAR | Status: DC | PRN
  Administered 2021-08-13: 1 mg

## 2021-08-13 MED ORDER — HYDROMORPHONE HCL 1 MG/ML IJ SOLN
INTRAMUSCULAR | Status: AC
  Filled 2021-08-13: qty 1

## 2021-08-13 MED ORDER — LACTATED RINGERS IV SOLN: INTRAVENOUS | Status: DC | PRN

## 2021-08-13 MED ORDER — MEPERIDINE HCL 25 MG/ML IJ SOLN: 6.2500 mg | INTRAMUSCULAR | Status: DC | PRN

## 2021-08-13 MED ORDER — MIDAZOLAM HCL 2 MG/2ML IJ SOLN
INTRAMUSCULAR | Status: AC
  Filled 2021-08-13: qty 2

## 2021-08-13 MED ORDER — PROMETHAZINE HCL 25 MG/ML IJ SOLN: 6.2500 mg | INTRAMUSCULAR | Status: DC | PRN

## 2021-08-13 MED ORDER — PROPOFOL 10 MG/ML IV BOLUS
INTRAVENOUS | Status: AC
  Filled 2021-08-13: qty 20

## 2021-08-13 MED ORDER — KETOROLAC TROMETHAMINE 30 MG/ML IJ SOLN
30.0000 mg | Freq: Once | INTRAMUSCULAR | Status: AC | PRN
  Administered 2021-08-13: 30 mg via INTRAVENOUS

## 2021-08-13 MED ORDER — HYDROCODONE-ACETAMINOPHEN 7.5-325 MG PO TABS: 1.0000 | ORAL_TABLET | Freq: Four times a day (QID) | ORAL | 0 refills | Status: DC | PRN

## 2021-08-13 MED ORDER — FENTANYL CITRATE (PF) 250 MCG/5ML IJ SOLN
INTRAMUSCULAR | Status: DC | PRN
  Administered 2021-08-13: 100 ug via INTRAVENOUS
  Administered 2021-08-13: 50 ug via INTRAVENOUS

## 2021-08-13 MED ORDER — SODIUM CHLORIDE 0.9 % IR SOLN
Status: DC | PRN
  Administered 2021-08-13: 3000 mL

## 2021-08-13 MED ORDER — HYDROMORPHONE HCL 1 MG/ML IJ SOLN
0.2500 mg | INTRAMUSCULAR | Status: DC | PRN
  Administered 2021-08-13 (×2): 0.5 mg via INTRAVENOUS

## 2021-08-13 MED ORDER — SUCCINYLCHOLINE CHLORIDE 200 MG/10ML IV SOSY
PREFILLED_SYRINGE | INTRAVENOUS | Status: AC
  Filled 2021-08-13: qty 10

## 2021-08-13 MED ORDER — OXYCODONE HCL 5 MG/5ML PO SOLN: 5.0000 mg | Freq: Once | ORAL | Status: AC | PRN

## 2021-08-13 MED ORDER — LACTATED RINGERS IV SOLN: INTRAVENOUS | Status: DC

## 2021-08-13 MED ORDER — ONDANSETRON HCL 4 MG/2ML IJ SOLN
INTRAMUSCULAR | Status: DC | PRN
  Administered 2021-08-13: 4 mg via INTRAVENOUS

## 2021-08-13 MED ORDER — LIDOCAINE 2% (20 MG/ML) 5 ML SYRINGE
INTRAMUSCULAR | Status: AC
  Filled 2021-08-13: qty 5

## 2021-08-13 MED ORDER — LIDOCAINE HCL (CARDIAC) PF 100 MG/5ML IV SOSY
PREFILLED_SYRINGE | INTRAVENOUS | Status: DC | PRN
  Administered 2021-08-13: 100 mg via INTRATRACHEAL

## 2021-08-13 MED ORDER — BUPIVACAINE-EPINEPHRINE 0.25% -1:200000 IJ SOLN
INTRAMUSCULAR | Status: DC | PRN
  Administered 2021-08-13: 15 mL

## 2021-08-13 MED ORDER — ONDANSETRON 4 MG PO TBDP: 4.0000 mg | ORAL_TABLET | Freq: Three times a day (TID) | ORAL | 0 refills | Status: AC | PRN

## 2021-08-13 MED ORDER — ACETAMINOPHEN 500 MG PO TABS
1000.0000 mg | ORAL_TABLET | Freq: Once | ORAL | Status: AC
  Administered 2021-08-13: 1000 mg via ORAL
  Filled 2021-08-13: qty 2

## 2021-08-13 MED ORDER — CLINDAMYCIN PHOSPHATE 900 MG/50ML IV SOLN
900.0000 mg | INTRAVENOUS | Status: AC
  Administered 2021-08-13: 900 mg via INTRAVENOUS
  Filled 2021-08-13: qty 50

## 2021-08-13 MED ORDER — FENTANYL CITRATE (PF) 250 MCG/5ML IJ SOLN
INTRAMUSCULAR | Status: AC
  Filled 2021-08-13: qty 5

## 2021-08-13 MED ORDER — PROPOFOL 10 MG/ML IV BOLUS
INTRAVENOUS | Status: DC | PRN
  Administered 2021-08-13: 300 mg via INTRAVENOUS

## 2021-08-13 MED ORDER — ROCURONIUM BROMIDE 10 MG/ML (PF) SYRINGE
PREFILLED_SYRINGE | INTRAVENOUS | Status: AC
  Filled 2021-08-13: qty 10

## 2021-08-13 MED ORDER — OXYCODONE HCL 5 MG PO TABS
ORAL_TABLET | ORAL | Status: AC
  Filled 2021-08-13: qty 1

## 2021-08-13 MED ORDER — OXYCODONE HCL 5 MG PO TABS
5.0000 mg | ORAL_TABLET | Freq: Once | ORAL | Status: AC | PRN
  Administered 2021-08-13: 5 mg via ORAL

## 2021-08-13 MED ORDER — EPINEPHRINE PF 1 MG/ML IJ SOLN
INTRAMUSCULAR | Status: AC
  Filled 2021-08-13: qty 1

## 2021-08-13 MED ORDER — MIDAZOLAM HCL 2 MG/2ML IJ SOLN
INTRAMUSCULAR | Status: DC | PRN
  Administered 2021-08-13: 2 mg via INTRAVENOUS

## 2021-08-13 MED ORDER — KETOROLAC TROMETHAMINE 30 MG/ML IJ SOLN
INTRAMUSCULAR | Status: AC
  Filled 2021-08-13: qty 1

## 2021-08-13 MED ORDER — BUPIVACAINE-EPINEPHRINE (PF) 0.25% -1:200000 IJ SOLN
INTRAMUSCULAR | Status: AC
  Filled 2021-08-13: qty 30

## 2021-08-13 SURGICAL SUPPLY — 29 items
BAG COUNTER SPONGE SURGICOUNT (BAG) ×2 IMPLANT
BAG SPNG CNTER NS LX DISP (BAG) ×1
BNDG ELASTIC 6X5.8 VLCR STR LF (GAUZE/BANDAGES/DRESSINGS) ×2 IMPLANT
CLSR STERI-STRIP ANTIMIC 1/2X4 (GAUZE/BANDAGES/DRESSINGS) ×1 IMPLANT
DRAPE ARTHROSCOPY W/POUCH 114 (DRAPES) ×2 IMPLANT
DRAPE U-SHAPE 47X51 STRL (DRAPES) ×2 IMPLANT
DRSG PAD ABDOMINAL 8X10 ST (GAUZE/BANDAGES/DRESSINGS) ×3 IMPLANT
DURAPREP 26ML APPLICATOR (WOUND CARE) ×2 IMPLANT
GAUZE 4X4 16PLY ~~LOC~~+RFID DBL (SPONGE) ×2 IMPLANT
GAUZE SPONGE 4X4 12PLY STRL (GAUZE/BANDAGES/DRESSINGS) ×2 IMPLANT
GAUZE XEROFORM 1X8 LF (GAUZE/BANDAGES/DRESSINGS) ×2 IMPLANT
GLOVE SRG 8 PF TXTR STRL LF DI (GLOVE) ×2 IMPLANT
GLOVE SURG ENC MOIS LTX SZ7.5 (GLOVE) ×4 IMPLANT
GLOVE SURG UNDER POLY LF SZ8 (GLOVE) ×4
GOWN STRL REUS W/ TWL LRG LVL3 (GOWN DISPOSABLE) ×1 IMPLANT
GOWN STRL REUS W/ TWL XL LVL3 (GOWN DISPOSABLE) ×2 IMPLANT
GOWN STRL REUS W/TWL LRG LVL3 (GOWN DISPOSABLE) ×2
GOWN STRL REUS W/TWL XL LVL3 (GOWN DISPOSABLE) ×4
KIT BASIN OR (CUSTOM PROCEDURE TRAY) ×2 IMPLANT
MANIFOLD NEPTUNE II (INSTRUMENTS) ×2 IMPLANT
NDL PRECISIONGLIDE 27X1.5 (NEEDLE) ×1 IMPLANT
NEEDLE PRECISIONGLIDE 27X1.5 (NEEDLE) ×2 IMPLANT
PACK ARTHROSCOPY DSU (CUSTOM PROCEDURE TRAY) ×2 IMPLANT
PADDING CAST COTTON 6X4 STRL (CAST SUPPLIES) ×2 IMPLANT
SUT MNCRL AB 3-0 PS2 18 (SUTURE) ×1 IMPLANT
SYR CONTROL 10ML LL (SYRINGE) ×2 IMPLANT
TOWEL GREEN STERILE (TOWEL DISPOSABLE) ×2 IMPLANT
TUBING ARTHROSCOPY IRRIG 16FT (MISCELLANEOUS) ×2 IMPLANT
WRAP KNEE MAXI GEL POST OP (GAUZE/BANDAGES/DRESSINGS) ×2 IMPLANT

## 2021-08-13 NOTE — Progress Notes (Signed)
Orthopedic Tech Progress Note Patient Details:  Joseph [name] 1966/03/24 [id number]  PACU RN called requesting a pair for CRUTCHES for patient.   Ortho Devices Type of Ortho Device: Crutches Ortho Device/Splint Interventions: Ordered, Application, Adjustment   Post Interventions Patient Tolerated: Ambulated well, Well Instructions Provided: Poper ambulation with device, Care of device, Adjustment of device  Donald Pore 08/13/2021, 5:50 PM

## 2021-08-13 NOTE — Anesthesia Preprocedure Evaluation (Addendum)
Anesthesia Evaluation  Patient identified by MRN, date of birth, ID band Patient awake    Reviewed: Allergy & Precautions, NPO status , Patient's Chart, lab work & pertinent test results  History of Anesthesia Complications (+) PROLONGED EMERGENCE and history of anesthetic complications  Airway Mallampati: IV  TM Distance: >3 FB Neck ROM: Full    Dental no notable dental hx. (+) Teeth Intact, Dental Advisory Given   Pulmonary former smoker,  Negative sleep study 2011 Snores at night, no witnessed apneas    Pulmonary exam normal breath sounds clear to auscultation       Cardiovascular hypertension (151/79 in preop, normally 130/80 ), Pt. on medications Normal cardiovascular exam Rhythm:Regular Rate:Normal     Neuro/Psych negative neurological ROS  negative psych ROS   GI/Hepatic Neg liver ROS, GERD  Medicated and Controlled,  Endo/Other  diabetes, Poorly Controlled, Type 2, Insulin DependentMorbid obesitya1c 8.2 BMI 44 Last night sliding scale insulin, no long acting last night or this AM Has glucose sensor on 120- in preop  Renal/GU negative Renal ROS  negative genitourinary   Musculoskeletal  (+) Arthritis , Osteoarthritis,  Right knee torn medial and lateral meniscus   Abdominal (+) + obese,   Peds  Hematology negative hematology ROS (+)   Anesthesia Other Findings   Reproductive/Obstetrics negative OB ROS                            Anesthesia Physical Anesthesia Plan  ASA: 3  Anesthesia Plan: General   Post-op Pain Management:    Induction: Intravenous  PONV Risk Score and Plan: 2 and Ondansetron, Dexamethasone and Treatment may vary due to age or medical condition  Airway Management Planned: LMA  Additional Equipment: None  Intra-op Plan:   Post-operative Plan: Extubation in OR  Informed Consent: I have reviewed the patients History and Physical, chart, labs and  discussed the procedure including the risks, benefits and alternatives for the proposed anesthesia with the patient or authorized representative who has indicated his/her understanding and acceptance.     Dental advisory given  Plan Discussed with: CRNA  Anesthesia Plan Comments:        Anesthesia Quick Evaluation

## 2021-08-13 NOTE — Anesthesia Postprocedure Evaluation (Signed)
Anesthesia Post Note  Patient: Joseph [name]  Procedure(s) Performed: KNEE ARTHROSCOPY WITH PARTIAL MEDIAL AND LATERAL MENISECTOMY (Right: Knee)     Patient location during evaluation: PACU Anesthesia Type: General Level of consciousness: awake and alert Pain management: pain level controlled Vital Signs Assessment: post-procedure vital signs reviewed and stable Respiratory status: spontaneous breathing Cardiovascular status: stable Anesthetic complications: no   No notable events documented.  Last Vitals:  Vitals:   08/13/21 1706 08/13/21 1721  BP: 120/78 128/81  Pulse: 66 72  Resp: 12 13  Temp:    SpO2: 94% 92%    Last Pain:  Vitals:   08/13/21 1706  TempSrc:   PainSc: Asleep                 Lewie Loron

## 2021-08-13 NOTE — Transfer of Care (Signed)
Immediate Anesthesia Transfer of Care Note  Patient: Joseph [name]  Procedure(s) Performed: KNEE ARTHROSCOPY WITH PARTIAL MEDIAL AND LATERAL MENISECTOMY (Right: Knee)  Patient Location: PACU  Anesthesia Type:General  Level of Consciousness: awake and alert   Airway & Oxygen Therapy: Patient Spontanous Breathing and Patient connected to nasal cannula oxygen  Post-op Assessment: Report given to RN and Post -op Vital signs reviewed and stable  Post vital signs: Reviewed and stable  Last Vitals:  Vitals Value Taken Time  BP 139/95 08/13/21 1636  Temp 36.2 C 08/13/21 1636  Pulse 73 08/13/21 1642  Resp 14 08/13/21 1642  SpO2 95 % 08/13/21 1642  Vitals shown include unvalidated device data.  Last Pain:  Vitals:   08/13/21 1257  TempSrc:   PainSc: 3          Complications: No notable events documented.

## 2021-08-13 NOTE — Op Note (Signed)
Surgery Date: 08/13/2021  Surgeon(s): Yolonda Kida, MD  Assistant: Dion Saucier, PA-C  Assistant attestation: PA Mcclung was present for the entire procedure.  ANESTHESIA:  general, and local  FLUIDS: Per anesthesia record.   ESTIMATED BLOOD LOSS: minimal  PREOPERATIVE DIAGNOSES:  1. Right knee medial and lateral meniscus tears 2.  Right knee synovitis  POSTOPERATIVE DIAGNOSES:  1. Right knee medial and lateral meniscus tears 2.  Right knee synovitis 3.  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  Right knee arthroscopy with limited synovectomy 2.  Right knee arthroscopy with arthroscopic partial medial and lateral meniscectomy 3.  Right knee arthroscopy with arthroscopic chondroplasty medial femoral condyle and lateral tibial plateau.  Trochlear groove  DESCRIPTION OF PROCEDURE: Mr. Mayorquin is a 55 y.o.-year-old male with right knee medial and lateral meniscus tear. Plans are to proceed with partial medial and lateral meniscectomies and diagnostic arthroscopy with debridement as indicated. Full discussion held regarding risks benefits alternatives and complications related surgical intervention. Conservative care options reviewed. All questions answered.  The patient was identified in the preoperative holding area and the operative extremity was marked. The patient was brought to the operating room and transferred to operating table in a supine position. Satisfactory general anesthesia was induced by anesthesiology.    Standard anterolateral, anteromedial arthroscopy portals were obtained. The anteromedial portal was obtained with a spinal needle for localization under direct visualization with subsequent diagnostic findings.   Anteromedial and anterolateral chambers: moderate synovitis. The synovitis was debrided with a 4.5 mm full radius shaver through both the anteromedial  and lateral portals.   Suprapatellar pouch and gutters: moderate synovitis or debris. Patella chondral surface: Grade 1 Trochlear chondral surface: Grade 2 Patellofemoral tracking: Midline and level Medial meniscus: Complex tear of the medial meniscus with short radial component at the junction of the mid body and posterior horn along the inferior leaflet of a large and contiguous horizontal cleavage tear from mid body to posterior horn.  Evidence of previous mid body partial meniscectomy with truncation of the meniscal body with.  Medial femoral condyle flexion bearing surface: Grade 3 Medial femoral condyle extension bearing surface: Grade 1 Medial tibial plateau: Grade 0 Anterior cruciate ligament:stable Posterior cruciate ligament:stable Lateral meniscus: White zone degenerative horizontal tearing noted along the mid body as well as at the posterior root adjacent to the bony root attachment..   Lateral femoral condyle flexion bearing surface: Grade 0 Lateral femoral condyle extension bearing surface: Grade 0 Lateral tibial plateau: Grade 3  Partial medial and lateral meniscectomies were carried out combination of motorized shaver as well as meniscal biter.  Contouring was taken back to stable tissue.  On the medial side, the entire inferior leaflet of the horizontal cleavage tear was resected.  Chondroplasty was undertaken with motorized shaver and suction.  Debridement technique was utilized on the lateral tibial plateau and medial femoral condyle as well as trochlea.  After completion of synovectomy, diagnostic exam, and debridements as described, all compartments were checked and no residual debris remained. Hemostasis was achieved with the cautery wand. The portals were approximated with buried monocryl. All excess fluid was expressed from the joint.  Xeroform sterile gauze dressings were applied followed by Ace bandage and ice pack.   DISPOSITION: The patient was awakened from general  anesthetic, extubated, taken to the recovery room in medically stable condition, no apparent complications. The patient may be weightbearing as tolerated to the  operative lower extremity.  Range of motion of right knee as tolerated.

## 2021-08-13 NOTE — Discharge Instructions (Addendum)

## 2021-08-13 NOTE — H&P (Signed)
ORTHOPAEDIC H&P  REQUESTING PHYSICIAN: Yolonda Kida, MD  PCP:  Lesia Hausen, MD  Chief Complaint: Right knee pain  HPI: Joseph [name] is a 55 y.o. male who complains of recalcitrant right knee pain.  He is here today for arthroscopic intervention of his right knee.  Today's encounter is related to a work injury.  He has failed conservative treatment and is opted for arthroscopic management.  Past Medical History:  Diagnosis Date   Arthritis    Complication of anesthesia    Pt reports "it takes me a while to wake up"   Diabetes mellitus    Family history of adverse reaction to anesthesia    pt's mother "had difficulty waking up from surgery"   GERD (gastroesophageal reflux disease)    Hypertension    Past Surgical History:  Procedure Laterality Date   CHOLECYSTECTOMY     LAPAROSCOPIC CHOLECYSTECTOMY SINGLE PORT N/A 03/01/2015   Procedure: LAPAROSCOPIC CHOLECYSTECTOMY SINGLE SITE AND CORE LIVER BIOPSY ;  Surgeon: Karie Soda, MD;  Location: [facility] ORS;  Service: General;  Laterality: N/A;   Social History   Socioeconomic History   Marital status: Married    Spouse name: Not on file   Number of children: Not on file   Years of education: Not on file   Highest education level: Not on file  Occupational History   Not on file  Tobacco Use   Smoking status: Former   Smokeless tobacco: Never  Substance and Sexual Activity   Alcohol use: Yes    Comment: social   Drug use: No   Sexual activity: Not on file  Other Topics Concern   Not on file  Social History Narrative   Not on file   Social Determinants of Health   Financial Resource Strain: Not on file  Food Insecurity: Not on file  Transportation Needs: Not on file  Physical Activity: Not on file  Stress: Not on file  Social Connections: Not on file   History reviewed. No pertinent family history. Allergies  Allergen Reactions   Penicillins Anaphylaxis   Levaquin [Levofloxacin] Other (See  Comments)    tendonitis   Lisinopril Cough   Erythromycin Rash    Azithromycin okay   Prior to Admission medications   Medication Sig Start Date End Date Taking? Authorizing Provider  acetaminophen (TYLENOL) 500 MG tablet Take 1,000 mg by mouth daily.   Yes [provider]  atorvastatin (LIPITOR) 40 MG tablet Take 40 mg by mouth daily.   Yes [provider]  Cinnamon 500 MG capsule Take 500 mg by mouth daily.   Yes [provider]  empagliflozin (JARDIANCE) 25 MG TABS tablet Take 25 mg by mouth daily.   Yes [provider]  fluticasone (FLONASE) 50 MCG/ACT nasal spray Place 2 sprays into both nostrils daily as needed for allergies or rhinitis.   Yes [provider]  hydrochlorothiazide (HYDRODIURIL) 25 MG tablet Take 25 mg by mouth daily.   Yes [provider]  ibuprofen (ADVIL) 200 MG tablet Take 400 mg by mouth every evening.   Yes [provider]  insulin glargine, 2 Unit Dial, (TOUJEO MAX SOLOSTAR) 300 UNIT/ML Solostar Pen Inject 150 Units into the skin every evening.   Yes [provider]  insulin lispro (HUMALOG) 100 UNIT/ML KwikPen Inject 9-30 Units into the skin 3 (three) times daily.   Yes [provider]  metFORMIN (GLUCOPHAGE) 500 MG tablet Take 1,000 mg by mouth 2 (two) times daily with a  meal.   Yes [provider]  Misc Natural Products (GLUCOSAMINE CHOND COMPLEX/MSM PO) Take 1 tablet by mouth daily.   Yes [provider]  Multiple Vitamins-Minerals (MULTIVITAMIN WITH MINERALS) tablet Take 1 tablet by mouth daily.   Yes [provider]  omeprazole (PRILOSEC) 40 MG capsule Take 40 mg by mouth daily.   Yes [provider]  tirzepatide Greggory Keen) 2.5 MG/0.5ML Pen Inject 2.5 mg into the skin once a week.   Yes [provider]  valsartan (DIOVAN) 80 MG tablet Take 80 mg by mouth daily.   Yes [provider]  albuterol (VENTOLIN HFA) 108 (90 Base)  MCG/ACT inhaler Inhale 2 puffs into the lungs every 4 (four) hours as needed for wheezing or shortness of breath.    [provider]  cyclobenzaprine (FLEXERIL) 10 MG tablet Take 1 tablet (10 mg total) by mouth 2 (two) times daily as needed for muscle spasms. Patient not taking: No sig reported 12/04/17   Fayrene Helper, PA-C   No results found.  Positive ROS: All other systems have been reviewed and were otherwise negative with the exception of those mentioned in the HPI and as above.  Physical Exam: General: Alert, no acute distress Cardiovascular: No pedal edema Respiratory: No cyanosis, no use of accessory musculature GI: No organomegaly, abdomen is soft and non-tender Skin: No lesions in the area of chief complaint Neurologic: Sensation intact distally Psychiatric: Patient is competent for consent with normal mood and affect Lymphatic: No axillary or cervical lymphadenopathy  MUSCULOSKELETAL:  Right lower extremity is warm and well-perfused with no open wounds.  Neurovascularly intact.  Assessment: 1.  Right knee medial meniscus tear, acute.  2.  Right knee lateral meniscus tear, acute.  Plan: -Plan to proceed today with right knee arthroscopic partial medial and lateral meniscectomies.  We again discussed the risk and benefits of the procedure in detail included but not limited to bleeding, infection, damage to surrounding nerves and vessels, stiffness, progression of arthritis, persistent pain and swelling, DVT, and the risk of anesthesia.  He has provided informed consent.    Yolonda Kida, MD Cell [phone]    08/13/2021 2:47 PM

## 2021-08-13 NOTE — Brief Op Note (Signed)
08/13/2021  4:41 PM  PATIENT:  Joseph [name]  55 y.o. male  PRE-OPERATIVE DIAGNOSIS:  Right knee torn medial and lateral meniscus  POST-OPERATIVE DIAGNOSIS:  Right knee torn medial and lateral meniscus  PROCEDURE:  Procedure(s) with comments: KNEE ARTHROSCOPY WITH PARTIAL MEDIAL AND LATERAL MENISECTOMY (Right) -  SURGEON:  Surgeon(s) and Role:    * Yolonda Kida, MD - Primary  PHYSICIAN ASSISTANT: Dion Saucier, PA-C   ANESTHESIA:   local and general  EBL:  10 cc  BLOOD ADMINISTERED:none  DRAINS: none   LOCAL MEDICATIONS USED:  MARCAINE     SPECIMEN:  No Specimen  DISPOSITION OF SPECIMEN:  N/A  COUNTS:  YES  TOURNIQUET:  * No tourniquets in log *  DICTATION: .Note written in EPIC  PLAN OF CARE: Discharge to home after PACU  PATIENT DISPOSITION:  PACU - hemodynamically stable.   Delay start of Pharmacological VTE agent (>24hrs) due to surgical blood loss or risk of bleeding: not applicable

## 2021-08-13 NOTE — Anesthesia Procedure Notes (Signed)
Procedure Name: LMA Insertion Date/Time: 08/13/2021 3:42 PM Performed by: Gwenyth Allegra, CRNA Pre-anesthesia Checklist: Patient identified, Emergency Drugs available, Suction available, Patient being monitored and Timeout performed Patient Re-evaluated:Patient Re-evaluated prior to induction Oxygen Delivery Method: Circle system utilized Preoxygenation: Pre-oxygenation with 100% oxygen Induction Type: IV induction LMA: LMA inserted LMA Size: 4.0 Number of attempts: 1 Placement Confirmation: positive ETCO2 and breath sounds checked- equal and bilateral Tube secured with: Tape Dental Injury: Teeth and Oropharynx as per pre-operative assessment

## 2021-08-14 ENCOUNTER — Encounter (HOSPITAL_COMMUNITY): Payer: Self-pay | Admitting: Orthopedic Surgery

## 2024-01-03 ENCOUNTER — Emergency Department (HOSPITAL_BASED_OUTPATIENT_CLINIC_OR_DEPARTMENT_OTHER): Payer: Worker's Compensation

## 2024-01-03 ENCOUNTER — Emergency Department (HOSPITAL_BASED_OUTPATIENT_CLINIC_OR_DEPARTMENT_OTHER)
Admission: EM | Admit: 2024-01-03 | Discharge: 2024-01-03 | Disposition: A | Discharge status: Home / Self Care | Payer: Worker's Compensation | Attending: Emergency Medicine | Admitting: Emergency Medicine

## 2024-01-03 ENCOUNTER — Other Ambulatory Visit: Payer: Self-pay

## 2024-01-03 ENCOUNTER — Emergency Department (HOSPITAL_BASED_OUTPATIENT_CLINIC_OR_DEPARTMENT_OTHER): Payer: Worker's Compensation | Admitting: Radiology

## 2024-01-03 ENCOUNTER — Emergency Department (HOSPITAL_BASED_OUTPATIENT_CLINIC_OR_DEPARTMENT_OTHER): Payer: 59

## 2024-01-03 ENCOUNTER — Encounter (HOSPITAL_BASED_OUTPATIENT_CLINIC_OR_DEPARTMENT_OTHER): Payer: Self-pay

## 2024-01-03 DIAGNOSIS — M25551 Pain in right hip: Secondary | ICD-10-CM | POA: Insufficient documentation

## 2024-01-03 DIAGNOSIS — Z7984 Long term (current) use of oral hypoglycemic drugs: Secondary | ICD-10-CM | POA: Insufficient documentation

## 2024-01-03 DIAGNOSIS — S63502A Unspecified sprain of left wrist, initial encounter: Secondary | ICD-10-CM | POA: Diagnosis not present

## 2024-01-03 DIAGNOSIS — Z794 Long term (current) use of insulin: Secondary | ICD-10-CM | POA: Insufficient documentation

## 2024-01-03 DIAGNOSIS — Y92410 Unspecified street and highway as the place of occurrence of the external cause: Secondary | ICD-10-CM | POA: Insufficient documentation

## 2024-01-03 DIAGNOSIS — Y990 Civilian activity done for income or pay: Secondary | ICD-10-CM | POA: Diagnosis not present

## 2024-01-03 DIAGNOSIS — S161XXA Strain of muscle, fascia and tendon at neck level, initial encounter: Secondary | ICD-10-CM | POA: Insufficient documentation

## 2024-01-03 DIAGNOSIS — S3992XA Unspecified injury of lower back, initial encounter: Secondary | ICD-10-CM | POA: Diagnosis present

## 2024-01-03 DIAGNOSIS — S39012A Strain of muscle, fascia and tendon of lower back, initial encounter: Secondary | ICD-10-CM | POA: Diagnosis not present

## 2024-01-03 MED ORDER — IBUPROFEN 600 MG PO TABS
600.0000 mg | ORAL_TABLET | Freq: Four times a day (QID) | ORAL | 0 refills | Status: AC | PRN
Start: 2024-01-03 — End: ?

## 2024-01-03 MED ORDER — CYCLOBENZAPRINE HCL 10 MG PO TABS: 10.0000 mg | ORAL_TABLET | Freq: Two times a day (BID) | ORAL | 0 refills | Status: AC | PRN

## 2024-01-03 MED ORDER — KETOROLAC TROMETHAMINE 30 MG/ML IJ SOLN
30.0000 mg | Freq: Once | INTRAMUSCULAR | Status: AC
  Administered 2024-01-03: 30 mg via INTRAMUSCULAR
  Filled 2024-01-03: qty 1

## 2024-01-03 NOTE — ED Notes (Signed)
Pt transported to CT ?
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
## 2024-04-05 ENCOUNTER — Encounter: Payer: Self-pay | Admitting: Orthopedic Surgery

## 2024-04-05 ENCOUNTER — Other Ambulatory Visit: Payer: Self-pay | Admitting: Orthopedic Surgery

## 2024-04-05 DIAGNOSIS — M546 Pain in thoracic spine: Secondary | ICD-10-CM

## 2024-09-16 ENCOUNTER — Encounter (HOSPITAL_BASED_OUTPATIENT_CLINIC_OR_DEPARTMENT_OTHER): Payer: Self-pay | Admitting: Orthopedic Surgery

## 2024-09-16 ENCOUNTER — Other Ambulatory Visit: Payer: Self-pay

## 2024-09-21 ENCOUNTER — Encounter (HOSPITAL_BASED_OUTPATIENT_CLINIC_OR_DEPARTMENT_OTHER)
Admission: RE | Admit: 2024-09-21 | Discharge: 2024-09-21 | Disposition: A | Discharge status: Home / Self Care | Source: Ambulatory Visit | Attending: Orthopedic Surgery | Admitting: Orthopedic Surgery

## 2024-09-21 DIAGNOSIS — E1165 Type 2 diabetes mellitus with hyperglycemia: Secondary | ICD-10-CM | POA: Diagnosis not present

## 2024-09-21 DIAGNOSIS — Z01818 Encounter for other preprocedural examination: Secondary | ICD-10-CM | POA: Insufficient documentation

## 2024-09-21 LAB — BASIC METABOLIC PANEL WITH GFR
Anion gap: 15 (ref 5–15)
BUN: 22 mg/dL — ABNORMAL HIGH (ref 6–20)
CO2: 22 mmol/L (ref 22–32)
Calcium: 9.5 mg/dL (ref 8.9–10.3)
Chloride: 100 mmol/L (ref 98–111)
Creatinine, Ser: 1.53 mg/dL — ABNORMAL HIGH (ref 0.61–1.24)
GFR, Estimated: 53 mL/min — ABNORMAL LOW (ref >60)
Glucose, Bld: 188 mg/dL — ABNORMAL HIGH (ref 70–99)
Potassium: 3.9 mmol/L (ref 3.5–5.1)
Sodium: 137 mmol/L (ref 135–145)

## 2024-09-21 NOTE — Progress Notes (Signed)

## 2024-09-23 ENCOUNTER — Ambulatory Visit (HOSPITAL_BASED_OUTPATIENT_CLINIC_OR_DEPARTMENT_OTHER)
Admission: RE | Admit: 2024-09-23 | Discharge: 2024-09-23 | Disposition: A | Discharge status: Home / Self Care | Attending: Orthopedic Surgery | Admitting: Orthopedic Surgery

## 2024-09-23 ENCOUNTER — Encounter (HOSPITAL_BASED_OUTPATIENT_CLINIC_OR_DEPARTMENT_OTHER)
Admission: RE | Disposition: A | Discharge status: Home / Self Care | Payer: Self-pay | Source: Home / Self Care | Attending: Orthopedic Surgery

## 2024-09-23 ENCOUNTER — Ambulatory Visit (HOSPITAL_BASED_OUTPATIENT_CLINIC_OR_DEPARTMENT_OTHER): Admitting: Anesthesiology

## 2024-09-23 ENCOUNTER — Encounter (HOSPITAL_BASED_OUTPATIENT_CLINIC_OR_DEPARTMENT_OTHER): Payer: Self-pay | Admitting: Orthopedic Surgery

## 2024-09-23 ENCOUNTER — Other Ambulatory Visit: Payer: Self-pay

## 2024-09-23 DIAGNOSIS — E1165 Type 2 diabetes mellitus with hyperglycemia: Secondary | ICD-10-CM

## 2024-09-23 DIAGNOSIS — X58XXXA Exposure to other specified factors, initial encounter: Secondary | ICD-10-CM | POA: Insufficient documentation

## 2024-09-23 DIAGNOSIS — Y990 Civilian activity done for income or pay: Secondary | ICD-10-CM | POA: Insufficient documentation

## 2024-09-23 DIAGNOSIS — S43492A Other sprain of left shoulder joint, initial encounter: Secondary | ICD-10-CM | POA: Diagnosis not present

## 2024-09-23 DIAGNOSIS — Z87891 Personal history of nicotine dependence: Secondary | ICD-10-CM | POA: Insufficient documentation

## 2024-09-23 DIAGNOSIS — Z7984 Long term (current) use of oral hypoglycemic drugs: Secondary | ICD-10-CM | POA: Insufficient documentation

## 2024-09-23 DIAGNOSIS — I10 Essential (primary) hypertension: Secondary | ICD-10-CM | POA: Diagnosis not present

## 2024-09-23 DIAGNOSIS — M19012 Primary osteoarthritis, left shoulder: Secondary | ICD-10-CM

## 2024-09-23 DIAGNOSIS — M25812 Other specified joint disorders, left shoulder: Secondary | ICD-10-CM | POA: Insufficient documentation

## 2024-09-23 DIAGNOSIS — Z7985 Long-term (current) use of injectable non-insulin antidiabetic drugs: Secondary | ICD-10-CM | POA: Insufficient documentation

## 2024-09-23 DIAGNOSIS — E119 Type 2 diabetes mellitus without complications: Secondary | ICD-10-CM | POA: Insufficient documentation

## 2024-09-23 DIAGNOSIS — M67912 Unspecified disorder of synovium and tendon, left shoulder: Secondary | ICD-10-CM | POA: Diagnosis not present

## 2024-09-23 DIAGNOSIS — Z794 Long term (current) use of insulin: Secondary | ICD-10-CM | POA: Diagnosis not present

## 2024-09-23 HISTORY — PX: POSTERIOR LUMBAR FUSION 2 WITH HARDWARE REMOVAL: SHX7297

## 2024-09-23 LAB — GLUCOSE, CAPILLARY
Glucose-Capillary: 202 mg/dL — ABNORMAL HIGH (ref 70–99)
Glucose-Capillary: 201 mg/dL — ABNORMAL HIGH (ref 70–99)

## 2024-09-23 SURGERY — ARTHROSCOPY, SHOULDER WITH DEBRIDEMENT
Anesthesia: Regional | Site: Shoulder | Laterality: Left

## 2024-09-23 MED ORDER — ACETAMINOPHEN 10 MG/ML IV SOLN
1000.0000 mg | Freq: Once | INTRAVENOUS | Status: DC | PRN
  Administered 2024-09-23: 1000 mg via INTRAVENOUS

## 2024-09-23 MED ORDER — SODIUM CHLORIDE 0.9 % IR SOLN
Status: DC | PRN
  Administered 2024-09-23: 1 mL

## 2024-09-23 MED ORDER — EPINEPHRINE PF 1 MG/ML IJ SOLN
INTRAMUSCULAR | Status: AC
  Filled 2024-09-23: qty 1

## 2024-09-23 MED ORDER — CLINDAMYCIN PHOSPHATE 900 MG/50ML IV SOLN
900.0000 mg | Freq: Once | INTRAVENOUS | Status: AC
  Administered 2024-09-23: 900 mg via INTRAVENOUS

## 2024-09-23 MED ORDER — DROPERIDOL 2.5 MG/ML IJ SOLN: 0.6250 mg | Freq: Once | INTRAMUSCULAR | Status: DC | PRN

## 2024-09-23 MED ORDER — DEXAMETHASONE SODIUM PHOSPHATE 4 MG/ML IJ SOLN
INTRAMUSCULAR | Status: DC | PRN
  Administered 2024-09-23: 4 mg via INTRAVENOUS

## 2024-09-23 MED ORDER — PROPOFOL 10 MG/ML IV BOLUS: INTRAVENOUS | Status: DC | PRN

## 2024-09-23 MED ORDER — FENTANYL CITRATE (PF) 100 MCG/2ML IJ SOLN: 25.0000 ug | INTRAMUSCULAR | Status: DC | PRN

## 2024-09-23 MED ORDER — FENTANYL CITRATE (PF) 100 MCG/2ML IJ SOLN
INTRAMUSCULAR | Status: AC
  Filled 2024-09-23: qty 2

## 2024-09-23 MED ORDER — LIDOCAINE HCL (CARDIAC) PF 100 MG/5ML IV SOSY
PREFILLED_SYRINGE | INTRAVENOUS | Status: DC | PRN
  Administered 2024-09-23: 100 mg via INTRAVENOUS

## 2024-09-23 MED ORDER — CLINDAMYCIN PHOSPHATE 900 MG/50ML IV SOLN
INTRAVENOUS | Status: AC
  Filled 2024-09-23: qty 50

## 2024-09-23 MED ORDER — FENTANYL CITRATE (PF) 100 MCG/2ML IJ SOLN
100.0000 ug | Freq: Once | INTRAMUSCULAR | Status: AC
  Administered 2024-09-23: 100 ug via INTRAVENOUS

## 2024-09-23 MED ORDER — PROPOFOL 10 MG/ML IV BOLUS
INTRAVENOUS | Status: DC | PRN
  Administered 2024-09-23: 250 mg via INTRAVENOUS

## 2024-09-23 MED ORDER — PHENYLEPHRINE HCL (PRESSORS) 10 MG/ML IV SOLN
INTRAVENOUS | Status: DC | PRN
  Administered 2024-09-23 (×2): 80 ug via INTRAVENOUS

## 2024-09-23 MED ORDER — ACETAMINOPHEN 10 MG/ML IV SOLN
INTRAVENOUS | Status: AC
  Filled 2024-09-23: qty 100

## 2024-09-23 MED ORDER — ROCURONIUM BROMIDE 100 MG/10ML IV SOLN
INTRAVENOUS | Status: DC | PRN
  Administered 2024-09-23: 70 mg via INTRAVENOUS

## 2024-09-23 MED ORDER — HYDROCODONE-ACETAMINOPHEN 7.5-325 MG PO TABS: 1.0000 | ORAL_TABLET | Freq: Four times a day (QID) | ORAL | 0 refills | Status: AC | PRN

## 2024-09-23 MED ORDER — BUPIVACAINE HCL (PF) 0.5 % IJ SOLN
INTRAMUSCULAR | Status: DC | PRN
  Administered 2024-09-23: 10 mL via PERINEURAL

## 2024-09-23 MED ORDER — SODIUM CHLORIDE 0.9 % IR SOLN
Status: DC | PRN
  Administered 2024-09-23: 2500 mL

## 2024-09-23 MED ORDER — BUPIVACAINE LIPOSOME 1.3 % IJ SUSP
INTRAMUSCULAR | Status: DC | PRN
  Administered 2024-09-23: 10 mL via PERINEURAL

## 2024-09-23 MED ORDER — FENTANYL CITRATE (PF) 100 MCG/2ML IJ SOLN
INTRAMUSCULAR | Status: DC | PRN
  Administered 2024-09-23 (×2): 50 ug via INTRAVENOUS

## 2024-09-23 MED ORDER — SUGAMMADEX SODIUM 200 MG/2ML IV SOLN
INTRAVENOUS | Status: DC | PRN
  Administered 2024-09-23: 300 mg via INTRAVENOUS
  Administered 2024-09-23: 100 mg via INTRAVENOUS

## 2024-09-23 MED ORDER — MIDAZOLAM HCL 2 MG/2ML IJ SOLN
INTRAMUSCULAR | Status: AC
  Filled 2024-09-23: qty 2

## 2024-09-23 MED ADMIN — Midazolam HCl Inj PF 2 MG/2ML (Base Equivalent): 2 mg | INTRAVENOUS | NDC 00409000101

## 2024-09-23 MED FILL — Ketorolac Tromethamine Inj 30 MG/ML: INTRAMUSCULAR | Qty: 3 | Status: AC

## 2024-09-23 SURGICAL SUPPLY — 46 items
BNDG COHESIVE 4X5 TAN STRL LF (GAUZE/BANDAGES/DRESSINGS) IMPLANT
BURR OVAL 8 FLU 4.0X13 (MISCELLANEOUS) IMPLANT
CANNULA 5.75X71 LONG (CANNULA) IMPLANT
CANNULA TWIST IN 8.25X7CM (CANNULA) IMPLANT
COOLER ICEMAN CLASSIC (MISCELLANEOUS) ×1 IMPLANT
CUTTER BONE 4.0MM X 13CM (MISCELLANEOUS) ×1 IMPLANT
DRAPE INCISE IOBAN 66X45 STRL (DRAPES) ×1 IMPLANT
DRAPE STERI 35X30 U-POUCH (DRAPES) ×1 IMPLANT
DRAPE SURG 17X23 STRL (DRAPES) ×1 IMPLANT
DRAPE U-SHAPE 47X51 STRL (DRAPES) ×1 IMPLANT
DRAPE U-SHAPE 76X120 STRL (DRAPES) ×2 IMPLANT
DURAPREP 26ML APPLICATOR (WOUND CARE) ×1 IMPLANT
GAUZE PAD ABD 8X10 STRL (GAUZE/BANDAGES/DRESSINGS) ×2 IMPLANT
GAUZE SPONGE 4X4 12PLY STRL (GAUZE/BANDAGES/DRESSINGS) ×1 IMPLANT
GAUZE XEROFORM 1X8 LF (GAUZE/BANDAGES/DRESSINGS) IMPLANT
GLOVE BIO SURGEON STRL SZ7.5 (GLOVE) ×2 IMPLANT
GLOVE BIOGEL PI IND STRL 8 (GLOVE) ×2 IMPLANT
GOWN STRL REUS W/ TWL LRG LVL3 (GOWN DISPOSABLE) ×1 IMPLANT
GOWN STRL REUS W/TWL XL LVL3 (GOWN DISPOSABLE) ×2 IMPLANT
LOOP 2 FIBERLINK CLOSED (SUTURE) IMPLANT
MANIFOLD NEPTUNE II (INSTRUMENTS) ×1 IMPLANT
NDL HD SCORPION MEGA LOADER (NEEDLE) IMPLANT
NDL SAFETY ECLIPSE 18X1.5 (NEEDLE) ×1 IMPLANT
PACK ARTHROSCOPY DSU (CUSTOM PROCEDURE TRAY) ×1 IMPLANT
PACK BASIN DAY SURGERY FS (CUSTOM PROCEDURE TRAY) ×1 IMPLANT
PAD COLD SHLDR WRAP-ON (PAD) ×1 IMPLANT
SLEEVE ARM SUSPENSION SYSTEM (MISCELLANEOUS) ×1 IMPLANT
SLEEVE SCD COMPRESS KNEE MED (STOCKING) ×1 IMPLANT
SLING ARM FOAM STRAP LRG (SOFTGOODS) IMPLANT
SLING ARM FOAM STRAP XLG (SOFTGOODS) IMPLANT
SLING S3 LATERAL DISP (MISCELLANEOUS) IMPLANT
SLING SHLDR PAD UNIV <17 (SOFTGOODS) IMPLANT
SPIKE FLUID TRANSFER (MISCELLANEOUS) IMPLANT
STRIP CLOSURE SKIN 1/2X4 (GAUZE/BANDAGES/DRESSINGS) ×1 IMPLANT
SUT ETHILON 3 0 PS 1 (SUTURE) IMPLANT
SUT MNCRL AB 3-0 PS2 27 (SUTURE) ×1 IMPLANT
SUT PDS AB 0 CT 36 (SUTURE) IMPLANT
SUT VIC AB 0 CT1 36 (SUTURE) IMPLANT
SUT VIC AB 2-0 CT1 TAPERPNT 27 (SUTURE) IMPLANT
SUTURE TAPE TIGERLINK 1.3MM BL (SUTURE) IMPLANT
SYR 5ML LL (SYRINGE) ×1 IMPLANT
TAPE FIBER 2MM 7IN #2 BLUE (SUTURE) IMPLANT
TOWEL GREEN STERILE FF (TOWEL DISPOSABLE) ×1 IMPLANT
TUBE CONNECTING 20X1/4 (TUBING) ×1 IMPLANT
TUBING ARTHROSCOPY IRRIG 16FT (MISCELLANEOUS) ×1 IMPLANT
WAND ABLATOR APOLLO I90 (BUR) ×1 IMPLANT

## 2024-09-23 NOTE — Anesthesia Procedure Notes (Signed)
 Procedure Name: Intubation Date/Time: 09/23/2024 10:51 AM  Performed by: Donnell Berwyn SQUIBB, CRNAPre-anesthesia Checklist: Patient identified, Emergency Drugs available, Suction available, Patient being monitored and Timeout performed Patient Re-evaluated:Patient Re-evaluated prior to induction Oxygen Delivery Method: Circle system utilized Preoxygenation: Pre-oxygenation with 100% oxygen Induction Type: IV induction Ventilation: Mask ventilation without difficulty Laryngoscope Size: Mac and 4 Grade View: Grade I Tube type: Oral Tube size: 7.5 mm Number of attempts: 1 Airway Equipment and Method: Stylet Placement Confirmation: ETT inserted through vocal cords under direct vision, positive ETCO2 and breath sounds checked- equal and bilateral Secured at: 24 cm Tube secured with: Tape Dental Injury: Teeth and Oropharynx as per pre-operative assessment

## 2024-09-23 NOTE — Brief Op Note (Signed)
 09/23/2024  12:02 PM  PATIENT:  Joseph [name]  58 y.o. 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  PROCEDURE:  Procedure(s) with comments: ARTHROSCOPY, SHOULDER WITH DEBRIDEMENT (Left) - Left shoulder arthroscopy with extensive debridement and distal clavicle resection and subacromial decompression  SURGEON:  Surgeons and Role:    * Sharl Selinda Dover, MD - Primary  PHYSICIAN ASSISTANT: Dayle Moores, PA-C    ANESTHESIA:   regional and general  EBL:  30 mL   BLOOD ADMINISTERED:none  DRAINS: none   LOCAL MEDICATIONS USED:  none   SPECIMEN:  No Specimen  DISPOSITION OF SPECIMEN:  N/A  COUNTS:  YES  TOURNIQUET:  * No tourniquets in log *  DICTATION: .Note written in EPIC  PLAN OF CARE: Discharge to home after PACU  PATIENT DISPOSITION:  PACU - hemodynamically stable.   Delay start of Pharmacological VTE agent (>24hrs) due to surgical blood loss or risk of bleeding: not applicable
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
## 2024-09-23 NOTE — Anesthesia Preprocedure Evaluation (Addendum)
 Anesthesia Evaluation  Patient identified by MRN, date of birth, ID band Patient awake    Reviewed: Allergy & Precautions, NPO status , Patient's Chart, lab work & pertinent test results  History of Anesthesia Complications (+) PROLONGED EMERGENCE and history of anesthetic complications  Airway Mallampati: II  TM Distance: >3 FB Neck ROM: Full    Dental no notable dental hx.    Pulmonary neg pulmonary ROS, former smoker   Pulmonary exam normal        Cardiovascular hypertension,  Rhythm:Regular Rate:Normal     Neuro/Psych negative neurological ROS  negative psych ROS   GI/Hepatic Neg liver ROS,GERD  ,,  Endo/Other  diabetes    Renal/GU negative Renal ROS  negative genitourinary   Musculoskeletal  (+) Arthritis , Osteoarthritis,    Abdominal Normal abdominal exam  (+)   Peds  Hematology negative hematology ROS (+)   Anesthesia Other Findings   Reproductive/Obstetrics                              Anesthesia Physical Anesthesia Plan  ASA: 2  Anesthesia Plan: General and Regional   Post-op Pain Management: Regional block*   Induction: Intravenous  PONV Risk Score and Plan: 2 and Ondansetron , Dexamethasone, Midazolam  and Treatment may vary due to age or medical condition  Airway Management Planned: Mask and Oral ETT  Additional Equipment: None  Intra-op Plan:   Post-operative Plan: Extubation in OR  Informed Consent: I have reviewed the patients History and Physical, chart, labs and discussed the procedure including the risks, benefits and alternatives for the proposed anesthesia with the patient or authorized representative who has indicated his/her understanding and acceptance.     Dental advisory given  Plan Discussed with: CRNA  Anesthesia Plan Comments:         Anesthesia Quick Evaluation

## 2024-09-23 NOTE — Transfer of Care (Signed)
 Immediate Anesthesia Transfer of Care Note  Patient: Joseph [name]  Procedure(s) Performed: ARTHROSCOPY, SHOULDER WITH DEBRIDEMENT (Left: Shoulder)  Patient Location: PACU  Anesthesia Type:General  Level of Consciousness: drowsy and patient cooperative  Airway & Oxygen Therapy: Patient Spontanous Breathing and Patient connected to face mask oxygen  Post-op Assessment: Report given to RN and Post -op Vital signs reviewed and stable  Post vital signs: Reviewed and stable  Last Vitals:  Vitals Value Taken Time  BP 152/82 09/23/24 11:55  Temp    Pulse 78 09/23/24 11:57  Resp 9 09/23/24 11:57  SpO2 94 % 09/23/24 11:57  Vitals shown include unfiled device data.  Last Pain:  Vitals:   09/23/24 0906  PainSc: 0-No pain         Complications: No notable events documented.

## 2024-09-23 NOTE — Op Note (Signed)
 Date of Surgery: 09/23/2024  INDICATIONS: [name] is a 58 y.o.-year-old male with a left shoulder traumatic bursitis with traumatic rotator cuff tendinitis and impingement with AC joint arthropathy and proximal biceps tendinopathy.;  The patient did consent to the procedure after discussion of the risks and benefits.  PREOPERATIVE DIAGNOSIS:   1.  Left shoulder traumatic AC joint arthropathy 2.  Left shoulder traumatic subacromial bursitis with impingement 3.  Left shoulder proximal biceps tendinitis 4.  Left shoulder labrum tear  POSTOPERATIVE DIAGNOSIS: Same.  PROCEDURE:  1.  Left shoulder arthroscopic extensive debridement of anterior labrum, superior labrum, biceps tenotomy, rotator interval debridement and subacromial bursectomy 2.  Left shoulder arthroscopic subacromial decompression 3.  Left shoulder arthroscopic distal clavicle resection of 1 cm. 4.  Left shoulder arthroscopic biceps tenotomy  SURGEON: Selinda SHAUNNA Gosling, M.D.  ASSIST: Dayle Moores, PA-C  Assistant attestation:  PA Mcclung scrubbed and present for the entire procedure..  ANESTHESIA:  general, interscalene with Exparel   IV FLUIDS AND URINE: See anesthesia.  ESTIMATED BLOOD LOSS: 10 mL.  IMPLANTS: None  DRAINS: None  COMPLICATIONS: None.  DESCRIPTION OF PROCEDURE: The patient was brought to the operating room and placed supine on the operating table.  The patient had been signed prior to the procedure and this was documented. The patient had the anesthesia placed by the anesthesiologist.  A time-out was performed to confirm that this was the correct patient, site, side and location. The patient did receive antibiotics prior to the incision and was re-dosed during the procedure as needed at indicated intervals.  A tourniquet was not placed.  The patient had the operative extremity prepped and draped in the standard surgical fashion.      After obtaining informed consent the patient was brought to the  operating table and underwent satisfactory anesthesia. An exam under anesthesia revealed full range of motion. He was placed in the right lateral decubitus position with an axillary roll and all bony prominences properly padded. A standard surgical timeout was performed. He was placed in 10 pounds of gentle in-line suspension.  Standard posterior and anterior superior portals were established. A diagnostic evaluation of the glenohumeral joint was performed. The biceps tendon was markedly synovitic and partially torn. It was tenotomized. An extensive debridement was performed of the superior anterior and posterior labrum.  The rotator interval was widely excised as well as releasing the middle glenohumeral ligament.  The articular surfaces revealed no significant chondromalacia . The subscapularis was intact. The rotator cuff was likewise intact of the supraspinatus, infraspinatus and teres minor.  No loose bodies.   The arthroscope was inserted in the subacromial space and an additional lateral portal was established. An acromioplasty performed nicely decompressing the subacromial space with a motorized burr. Bursitis in the subacromial space was removed.  There was some hyperemia in the rotator cuff tendon consistent with the traumatic impingement and bursitis.  Next, we proceeded with the distal clavicle resection.  The arthroscope was placed in the lateral portal and the radiofrequency ablation wand in the mid glenoid portal.  We performed a subperiosteal dissection of the [facility] joint.  At this time we noted inferior osteophyte as well as moderate edema and hyperemia in the capsule of the [facility] joint.  We then introduced the burr through the mid glenoid portal and resected 1 cm of distal clavicle.  We also removed about 2 mm medial acromion.  The arthroscope was then removed and portals closed with 3-0 Monocryl in standard fashion followed by a  sterile occlusive dressing Polar Care ice sleeve and a slingshot  sling. The patient was sent to recovery in stable condition and tolerated the procedure well  POSTOPERATIVE PLAN: Joseph [name] will be in a sling until his nerve block has resolved.  He then may begin using the left arm immediately for all activities of daily living and light lifting up to 5 pounds.  Will see him back in the office in 2 weeks.

## 2024-09-23 NOTE — Anesthesia Postprocedure Evaluation (Signed)
 Anesthesia Post Note  Patient: Joseph [name]  Procedure(s) Performed: ARTHROSCOPY, SHOULDER WITH DEBRIDEMENT (Left: Shoulder)     Patient location during evaluation: PACU Anesthesia Type: Regional and General Level of consciousness: awake and alert Pain management: pain level controlled Vital Signs Assessment: post-procedure vital signs reviewed and stable Respiratory status: spontaneous breathing, nonlabored ventilation, respiratory function stable and patient connected to nasal cannula oxygen Cardiovascular status: blood pressure returned to baseline and stable Postop Assessment: no apparent nausea or vomiting Anesthetic complications: no   No notable events documented.  Last Vitals:  Vitals:   09/23/24 1315 09/23/24 1330  BP: 121/69 (!) 141/87  Pulse: 74 77  Resp: (!) 0   Temp:  36.8 C  SpO2: 98% 94%    Last Pain:  Vitals:   09/23/24 1330  PainSc: 2                  Cordella P Denyla Cortese

## 2024-09-23 NOTE — Anesthesia Procedure Notes (Signed)
 Anesthesia Regional Block: Interscalene brachial plexus block   Pre-Anesthetic Checklist: , timeout performed,  Correct Patient, Correct Site, Correct Laterality,  Correct Procedure, Correct Position, site marked,  Risks and benefits discussed,  Surgical consent,  Pre-op evaluation,  At surgeon's request and post-op pain management  Laterality: Left  Prep: Dura Prep       Needles:  Injection technique: Single-shot  Needle Type: Echogenic Stimulator Needle     Needle Length: 5cm  Needle Gauge: 20     Additional Needles:   Procedures:,,,, ultrasound used (permanent image in chart),,    Narrative:  Start time: 09/23/2024 9:47 AM End time: 09/23/2024 9:50 AM Injection made incrementally with aspirations every 5 mL.  Performed by: Personally  Anesthesiologist: Dorethea Cordella SQUIBB, DO  Additional Notes: Patient identified. Risks/Benefits/Options discussed with patient including but not limited to bleeding, infection, nerve damage, failed block, incomplete pain control. Patient expressed understanding and wished to proceed. All questions were answered. Sterile technique was used throughout the entire procedure. Please see nursing notes for vital signs. Aspirated in 5cc intervals with injection for negative confirmation. Patient was given instructions on fall risk and not to get out of bed. All questions and concerns addressed with instructions to call with any issues or inadequate analgesia.

## 2024-09-23 NOTE — Progress Notes (Signed)
 Assisted Dr. Gavin Potters with left, interscalene , ultrasound guided block. Side rails up, monitors on throughout procedure. See vital signs in flow sheet. Tolerated Procedure well.

## 2024-09-23 NOTE — Discharge Instructions (Addendum)
 Orthopedic surgery discharge instructions:  - Maintain postoperative bandage for 3 days.  On the third day from surgery you may remove the clear bandage and leave the Steri-Strips in place.  The Steri-Strips will remain for 2 weeks.  You may shower on the third day from surgery.  Please do not submerge underwater.  -No lifting over 2 pounds with operateive arm.  You may use the arm immediately for activities of daily living such as bathing, washing your face and brushing your teeth, eating, and getting dressed.  Otherwise maintain your sling when you are out of the house and sleeping.  -Apply ice liberally to the shoulder throughout the day.  For mild to moderate pain use Tylenol  and Advil  as needed around-the-clock.  For breakthrough pain use oxycodone  as necessary.  -You will return to see Dr. Sharl in the office in 2 weeks for routine postoperative check.    Post Anesthesia Home Care Instructions  Activity: Get plenty of rest for the remainder of the day. A responsible individual must stay with you for 24 hours following the procedure.  For the next 24 hours, DO NOT: -Drive a car -Advertising copywriter -Drink alcoholic beverages -Take any medication unless instructed by your physician -Make any legal decisions or sign important papers.  Meals: Start with liquid foods such as gelatin or soup. Progress to regular foods as tolerated. Avoid greasy, spicy, heavy foods. If nausea and/or vomiting occur, drink only clear liquids until the nausea and/or vomiting subsides. Call your physician if vomiting continues.  Special Instructions/Symptoms: Your throat may feel dry or sore from the anesthesia or the breathing tube placed in your throat during surgery. If this causes discomfort, gargle with warm salt water. The discomfort should disappear within 24 hours.  If you had a scopolamine patch placed behind your ear for the management of post- operative nausea and/or vomiting:  1. The medication  in the patch is effective for 72 hours, after which it should be removed.  Wrap patch in a tissue and discard in the trash. Wash hands thoroughly with soap and water. 2. You may remove the patch earlier than 72 hours if you experience unpleasant side effects which may include dry mouth, dizziness or visual disturbances. 3. Avoid touching the patch. Wash your hands with soap and water after contact with the patch.     Regional Anesthesia Blocks  1. You may not be able to move or feel the blocked extremity after a regional anesthetic block. This may last may last from 3-48 hours after placement, but it will go away. The length of time depends on the medication injected and your individual response to the medication. As the nerves start to wake up, you may experience tingling as the movement and feeling returns to your extremity. If the numbness and inability to move your extremity has not gone away after 48 hours, please call your surgeon.   2. The extremity that is blocked will need to be protected until the numbness is gone and the strength has returned. Because you cannot feel it, you will need to take extra care to avoid injury. Because it may be weak, you may have difficulty moving it or using it. You may not know what position it is in without looking at it while the block is in effect.  3. For blocks in the legs and feet, returning to weight bearing and walking needs to be done carefully. You will need to wait until the numbness is entirely gone and the  strength has returned. You should be able to move your leg and foot normally before you try and bear weight or walk. You will need someone to be with you when you first try to ensure you do not fall and possibly risk injury.  4. Bruising and tenderness at the needle site are common side effects and will resolve in a few days.  5. Persistent numbness or new problems with movement should be communicated to the surgeon or the [facility] [phone] [facility] [phone]).  Information for Discharge Teaching: EXPAREL  (bupivacaine  liposome injectable suspension)   Pain relief is important to your recovery. The goal is to control your pain so you can move easier and return to your normal activities as soon as possible after your procedure. Your physician may use several types of medicines to manage pain, swelling, and more.  Your surgeon or anesthesiologist gave you EXPAREL (bupivacaine ) to help control your pain after surgery.  EXPAREL  is a local anesthetic designed to release slowly over an extended period of time to provide pain relief by numbing the tissue around the surgical site. EXPAREL  is designed to release pain medication over time and can control pain for up to 72 hours. Depending on how you respond to EXPAREL , you may require less pain medication during your recovery. EXPAREL  can help reduce or eliminate the need for opioids during the first few days after surgery when pain relief is needed the most. EXPAREL  is not an opioid and is not addictive. It does not cause sleepiness or sedation.   Important! A teal colored band has been placed on your arm with the date, time and amount of EXPAREL  you have received. Please leave this armband in place for the full 96 hours following administration, and then you may remove the band. If you return to the hospital for any reason within 96 hours following the administration of EXPAREL , the armband provides important information that your health care providers to know, and alerts them that you have received this anesthetic.    Possible side effects of EXPAREL : Temporary loss of sensation or ability to move in the area where medication was injected. Nausea, vomiting, constipation Rarely, numbness and tingling in your mouth or lips, lightheadedness, or anxiety may occur. Call your doctor right away if you think you may be experiencing any of these sensations,  or if you have other questions regarding possible side effects.  Follow all other discharge instructions given to you by your surgeon or nurse. Eat a healthy diet and drink plenty of water or other fluids.  No tylenol  until 6:37pm today

## 2024-09-24 ENCOUNTER — Encounter (HOSPITAL_BASED_OUTPATIENT_CLINIC_OR_DEPARTMENT_OTHER): Payer: Self-pay | Admitting: Orthopedic Surgery

## 2024-12-13 ENCOUNTER — Encounter (HOSPITAL_BASED_OUTPATIENT_CLINIC_OR_DEPARTMENT_OTHER): Payer: Self-pay | Admitting: Orthopedic Surgery
